# Patient Record
Sex: Female | Born: 1955
Health system: Southern US, Community
[De-identification: ages and names within clinical notes are randomized; demographics above are authoritative.]

## PROBLEM LIST (undated history)

## (undated) DIAGNOSIS — R413 Other amnesia: Secondary | ICD-10-CM

## (undated) DIAGNOSIS — N189 Chronic kidney disease, unspecified: Secondary | ICD-10-CM

## (undated) DIAGNOSIS — G4733 Obstructive sleep apnea (adult) (pediatric): Secondary | ICD-10-CM

## (undated) DIAGNOSIS — I639 Cerebral infarction, unspecified: Secondary | ICD-10-CM

## (undated) DIAGNOSIS — I719 Aortic aneurysm of unspecified site, without rupture: Secondary | ICD-10-CM

## (undated) DIAGNOSIS — D649 Anemia, unspecified: Secondary | ICD-10-CM

## (undated) DIAGNOSIS — G629 Polyneuropathy, unspecified: Secondary | ICD-10-CM

## (undated) DIAGNOSIS — I10 Essential (primary) hypertension: Secondary | ICD-10-CM

## (undated) DIAGNOSIS — R2241 Localized swelling, mass and lump, right lower limb: Secondary | ICD-10-CM

## (undated) DIAGNOSIS — M317 Microscopic polyangiitis: Secondary | ICD-10-CM

## (undated) DIAGNOSIS — C801 Malignant (primary) neoplasm, unspecified: Secondary | ICD-10-CM

## (undated) DIAGNOSIS — E119 Type 2 diabetes mellitus without complications: Secondary | ICD-10-CM

## (undated) HISTORY — DX: Cerebral infarction, unspecified: I63.9

## (undated) HISTORY — PX: WRIST FRACTURE SURGERY: SHX121

## (undated) HISTORY — PX: TOTAL VAGINAL HYSTERECTOMY: SHX2548

## (undated) HISTORY — PX: PARTIAL HIP ARTHROPLASTY: SHX733

## (undated) HISTORY — PX: COLONOSCOPY: SHX174

---

## 2000-07-04 HISTORY — PX: GALLBLADDER SURGERY: SHX652

## 2002-07-04 DIAGNOSIS — N189 Chronic kidney disease, unspecified: Secondary | ICD-10-CM

## 2002-07-04 HISTORY — DX: Chronic kidney disease, unspecified: N18.9

## 2010-02-27 ENCOUNTER — Emergency Department (HOSPITAL_COMMUNITY): Admission: EM | Admit: 2010-02-27 | Discharge: 2010-02-27 | Payer: Self-pay | Admitting: Family Medicine

## 2012-07-28 ENCOUNTER — Emergency Department (HOSPITAL_COMMUNITY): Payer: PRIVATE HEALTH INSURANCE

## 2012-07-28 ENCOUNTER — Inpatient Hospital Stay (HOSPITAL_COMMUNITY)
Admission: EM | Admit: 2012-07-28 | Discharge: 2012-07-31 | DRG: 065 | Disposition: A | Discharge status: Rehabilitation Facility | Payer: PRIVATE HEALTH INSURANCE | Attending: Internal Medicine | Admitting: Internal Medicine

## 2012-07-28 ENCOUNTER — Encounter (HOSPITAL_COMMUNITY): Payer: Self-pay | Admitting: Physical Medicine and Rehabilitation

## 2012-07-28 ENCOUNTER — Observation Stay (HOSPITAL_COMMUNITY): Payer: PRIVATE HEALTH INSURANCE

## 2012-07-28 DIAGNOSIS — G8190 Hemiplegia, unspecified affecting unspecified side: Secondary | ICD-10-CM | POA: Diagnosis present

## 2012-07-28 DIAGNOSIS — E785 Hyperlipidemia, unspecified: Secondary | ICD-10-CM | POA: Diagnosis present

## 2012-07-28 DIAGNOSIS — Z79899 Other long term (current) drug therapy: Secondary | ICD-10-CM

## 2012-07-28 DIAGNOSIS — E876 Hypokalemia: Secondary | ICD-10-CM | POA: Diagnosis present

## 2012-07-28 DIAGNOSIS — E669 Obesity, unspecified: Secondary | ICD-10-CM | POA: Diagnosis present

## 2012-07-28 DIAGNOSIS — E1142 Type 2 diabetes mellitus with diabetic polyneuropathy: Secondary | ICD-10-CM | POA: Diagnosis present

## 2012-07-28 DIAGNOSIS — E1149 Type 2 diabetes mellitus with other diabetic neurological complication: Secondary | ICD-10-CM | POA: Diagnosis present

## 2012-07-28 DIAGNOSIS — R220 Localized swelling, mass and lump, head: Secondary | ICD-10-CM | POA: Diagnosis present

## 2012-07-28 DIAGNOSIS — I6350 Cerebral infarction due to unspecified occlusion or stenosis of unspecified cerebral artery: Secondary | ICD-10-CM

## 2012-07-28 DIAGNOSIS — Z794 Long term (current) use of insulin: Secondary | ICD-10-CM

## 2012-07-28 DIAGNOSIS — I639 Cerebral infarction, unspecified: Secondary | ICD-10-CM | POA: Diagnosis present

## 2012-07-28 DIAGNOSIS — R471 Dysarthria and anarthria: Secondary | ICD-10-CM | POA: Diagnosis present

## 2012-07-28 DIAGNOSIS — Z6834 Body mass index (BMI) 34.0-34.9, adult: Secondary | ICD-10-CM

## 2012-07-28 DIAGNOSIS — I6340 Cerebral infarction due to embolism of unspecified cerebral artery: Principal | ICD-10-CM | POA: Diagnosis present

## 2012-07-28 DIAGNOSIS — Z7982 Long term (current) use of aspirin: Secondary | ICD-10-CM

## 2012-07-28 DIAGNOSIS — M317 Microscopic polyangiitis: Secondary | ICD-10-CM | POA: Diagnosis present

## 2012-07-28 DIAGNOSIS — Z87891 Personal history of nicotine dependence: Secondary | ICD-10-CM

## 2012-07-28 DIAGNOSIS — M300 Polyarteritis nodosa: Secondary | ICD-10-CM | POA: Diagnosis present

## 2012-07-28 DIAGNOSIS — R4701 Aphasia: Secondary | ICD-10-CM | POA: Diagnosis present

## 2012-07-28 DIAGNOSIS — Z23 Encounter for immunization: Secondary | ICD-10-CM

## 2012-07-28 DIAGNOSIS — E119 Type 2 diabetes mellitus without complications: Secondary | ICD-10-CM | POA: Diagnosis present

## 2012-07-28 DIAGNOSIS — I10 Essential (primary) hypertension: Secondary | ICD-10-CM | POA: Diagnosis present

## 2012-07-28 HISTORY — DX: Aortic aneurysm of unspecified site, without rupture: I71.9

## 2012-07-28 HISTORY — DX: Type 2 diabetes mellitus without complications: E11.9

## 2012-07-28 HISTORY — DX: Microscopic polyangiitis: M31.7

## 2012-07-28 HISTORY — DX: Obstructive sleep apnea (adult) (pediatric): G47.33

## 2012-07-28 HISTORY — DX: Essential (primary) hypertension: I10

## 2012-07-28 LAB — DIFFERENTIAL
Basophils Relative: 0 % (ref 0–1)
Eosinophils Absolute: 0.4 10*3/uL (ref 0.0–0.7)
Lymphocytes Relative: 35 % (ref 12–46)
Lymphs Abs: 3.4 10*3/uL (ref 0.7–4.0)
Monocytes Absolute: 0.5 10*3/uL (ref 0.1–1.0)
Monocytes Relative: 6 % (ref 3–12)
Neutro Abs: 5.4 10*3/uL (ref 1.7–7.7)
Neutrophils Relative %: 55 % (ref 43–77)

## 2012-07-28 LAB — COMPREHENSIVE METABOLIC PANEL
CO2: 26 mEq/L (ref 19–32)
Creatinine, Ser: 0.79 mg/dL (ref 0.50–1.10)
GFR calc Af Amer: >90 mL/min (ref >90)
Sodium: 137 mEq/L (ref 135–145)
Total Protein: 8 g/dL (ref 6.0–8.3)

## 2012-07-28 LAB — URINE MICROSCOPIC-ADD ON

## 2012-07-28 LAB — URINALYSIS, ROUTINE W REFLEX MICROSCOPIC
Glucose, UA: NEGATIVE mg/dL
Hgb urine dipstick: NEGATIVE
Urobilinogen, UA: 0.2 mg/dL (ref 0.0–1.0)

## 2012-07-28 LAB — CBC
Hemoglobin: 12.8 g/dL (ref 12.0–15.0)
MCH: 30.7 pg (ref 26.0–34.0)
MCHC: 33.3 g/dL (ref 30.0–36.0)
RBC: 4.17 MIL/uL (ref 3.87–5.11)
RDW: 12.8 % (ref 11.5–15.5)
WBC: 9.7 10*3/uL (ref 4.0–10.5)

## 2012-07-28 LAB — POCT I-STAT TROPONIN I: Troponin i, poc: 0 ng/mL (ref 0.00–0.08)

## 2012-07-28 LAB — GLUCOSE, CAPILLARY
Glucose-Capillary: 124 mg/dL — ABNORMAL HIGH (ref 70–99)
Glucose-Capillary: 239 mg/dL — ABNORMAL HIGH (ref 70–99)

## 2012-07-28 LAB — APTT: aPTT: 39 seconds — ABNORMAL HIGH (ref 24–37)

## 2012-07-28 LAB — PROTIME-INR: INR: 1 (ref 0.00–1.49)

## 2012-07-28 LAB — RAPID URINE DRUG SCREEN, HOSP PERFORMED
Barbiturates: NOT DETECTED
Benzodiazepines: NOT DETECTED

## 2012-07-28 MED ORDER — INSULIN ASPART 100 UNIT/ML ~~LOC~~ SOLN
0.0000 [IU] | SUBCUTANEOUS | Status: DC
  Administered 2012-07-28: 3 [IU] via SUBCUTANEOUS
  Administered 2012-07-29: 1 [IU] via SUBCUTANEOUS
  Administered 2012-07-29: 3 [IU] via SUBCUTANEOUS
  Administered 2012-07-29 (×2): 1 [IU] via SUBCUTANEOUS
  Administered 2012-07-30: 2 [IU] via SUBCUTANEOUS

## 2012-07-28 MED ORDER — ASPIRIN 81 MG PO CHEW
324.0000 mg | CHEWABLE_TABLET | Freq: Once | ORAL | Status: AC
  Administered 2012-07-28: 324 mg via ORAL
  Filled 2012-07-28: qty 4

## 2012-07-28 MED ORDER — SENNOSIDES-DOCUSATE SODIUM 8.6-50 MG PO TABS: 1.0000 | ORAL_TABLET | Freq: Every evening | ORAL | Status: DC | PRN

## 2012-07-28 MED ORDER — POTASSIUM CHLORIDE CRYS ER 20 MEQ PO TBCR
20.0000 meq | EXTENDED_RELEASE_TABLET | Freq: Two times a day (BID) | ORAL | Status: DC
  Administered 2012-07-28 – 2012-07-30 (×5): 20 meq via ORAL
  Filled 2012-07-28 (×7): qty 1

## 2012-07-28 MED ORDER — ENOXAPARIN SODIUM 40 MG/0.4ML ~~LOC~~ SOLN
40.0000 mg | SUBCUTANEOUS | Status: DC
  Administered 2012-07-28 – 2012-07-30 (×3): 40 mg via SUBCUTANEOUS
  Filled 2012-07-28 (×4): qty 0.4

## 2012-07-28 MED ORDER — INFLUENZA VIRUS VACC SPLIT PF IM SUSP
0.5000 mL | INTRAMUSCULAR | Status: AC
  Administered 2012-07-29: 0.5 mL via INTRAMUSCULAR
  Filled 2012-07-28: qty 0.5

## 2012-07-28 MED ORDER — HYDRALAZINE HCL 20 MG/ML IJ SOLN
5.0000 mg | Freq: Four times a day (QID) | INTRAMUSCULAR | Status: DC | PRN
  Filled 2012-07-28: qty 0.25

## 2012-07-28 NOTE — H&P (Addendum)
[facility] History and Physical  Rhilyn Davis XBJ:[id number] DOB: April 16, 1956 DOA: 07/28/2012  Referring physician: EDP PCP: Geraldo Pitter, MD  Specialists:   Chief Complaint: Slurred Speech and Numbness Right Side  HPI: Sandy Davis is a 57 y.o. female who felt dizzy and weak and then fell down to the floor, and began to experience blurry vision, slurring of her speech and right sided weakness.   She reports also having a frontal headache at that time.   Her symptoms began at 2:30 pm and she arrived at the ED at 1615 and a Code Stroke was called.  She was assessed  By Neurology and was not deemed appropriate for TPA therapy after evaluation and having a NIH score of 2.   She was found to have acute infarcts of the left cerebellum and left inferior occipital lobe anda subacute lacunar infarct in the Superior Left Thalmus.      Review of Systems: The patient denies anorexia, fever, chills, weight loss, vision loss, decreased hearing, hoarseness, chest pain, syncope, dyspnea on exertion, peripheral edema, balance deficits, hemoptysis, abdominal pain, nausea, vomiting, diarrhea, hematemesis, melena, hematochezia, severe indigestion/heartburn, dysuria, hematuria, incontinence, urinary retention,  suspicious skin lesions, transient blindness, depression, unusual weight change, abnormal bleeding, enlarged lymph nodes, angioedema, and breast masses.    Past Medical History  Diagnosis Date  . Hypertension   . Diabetes mellitus without complication   . Aortic aneurysm    Physical Exam:  GEN:  Pleasant 57 year old Obese African American Female examined  and in no acute distress; cooperative with exam Filed Vitals:   07/28/12 1730 07/28/12 1804 07/28/12 1859 07/28/12 2103  BP: 177/119 162/90 183/98 168/90  Pulse: 63 72 67 60  Temp:    98.1 F (36.7 C)  TempSrc:    Oral  Resp:  14 14 16   SpO2: 100% 99% 97% 100%   Blood pressure 168/90, pulse 60, temperature 98.1 F (36.7 C),  temperature source Oral, resp. rate 16, SpO2 100.00%. PSYCH: She is alert and oriented x4; does not appear anxious does not appear depressed; affect is normal HEENT: Normocephalic and Atraumatic, Mucous membranes pink; PERRLA; EOM intact; Fundi:  Benign;  No scleral icterus, Nares: Patent, Oropharynx: Clear, Fair Dentition, Neck:  FROM, no cervical lymphadenopathy nor thyromegaly or carotid bruit; no JVD; Breasts:: Not examined CHEST WALL: No tenderness CHEST: Normal respiration, clear to auscultation bilaterally HEART: Regular rate and rhythm; no murmurs rubs or gallops BACK: No kyphosis or scoliosis; no CVA tenderness ABDOMEN: Positive Bowel Sounds,  Obese, soft non-tender; no masses, no organomegaly. Rectal Exam: Not done EXTREMITIES: No cyanosis, clubbing or edema; no ulcerations. Genitalia: not examined PULSES: 2+ and symmetric SKIN: Normal hydration no rash or ulceration CNS: Cranial nerves 2-12 grossly intact;   No facial droop, No dysarthria but hesitation,  Decreased grip strength Right hand versus Left Hand,    Cerebellar function:  Finger to Nose  Poor with right Index finger, MiId Right Hemiparesis.       Labs on Admission:  Basic Metabolic Panel:  Lab 07/28/12 3086  NA 137  K 3.4*  CL 101  CO2 26  GLUCOSE 122*  BUN 13  CREATININE 0.79  CALCIUM 10.9*  MG --  PHOS --   Liver Function Tests:  Lab 07/28/12 1645  AST 15  ALT 16  ALKPHOS 103  BILITOT 0.2*  PROT 8.0  ALBUMIN 4.0   No results found for this basename: LIPASE:5,AMYLASE:5 in the last 168 hours No results found for this  basename: AMMONIA:5 in the last 168 hours CBC:  Lab 07/28/12 1645  WBC 9.7  NEUTROABS 5.4  HGB 12.8  HCT 38.4  MCV 92.1  PLT 262   Cardiac Enzymes:  Lab 07/28/12 1645  CKTOTAL --  CKMB --  CKMBINDEX --  TROPONINI <0.30    BNP (last 3 results) No results found for this basename: PROBNP:3 in the last 8760 hours CBG:  Lab 07/28/12 1638  GLUCAP 124*    Radiological  Exams on Admission: Ct Head Wo Contrast  07/28/2012  *RADIOLOGY REPORT*  Clinical Data: 57 year old female Code stroke.  Right side weakness.  Last seen normal 1430 hours.  CT HEAD WITHOUT CONTRAST  Technique:  Contiguous axial images were obtained from the base of the skull through the vertex without contrast.  Comparison: None.  Findings: Visualized paranasal sinuses and mastoids are clear.  No acute osseous abnormality identified.  Visualized orbit soft tissues are within normal limits.  Visualized scalp soft tissues are within normal limits.  The 15 mm focus of hypodensity in the left lateral cerebellum on series 2 image 7.  No associated hemorrhage or mass effect.  Other posterior fossa gray-white matter differentiation within normal limits.  Hypodensity in the left deep gray matter nuclei near the genu of the left internal capsule.  No mass effect or hemorrhage.  Minimal to mild cerebral white matter hypodensity elsewhere.  No ventriculomegaly. No midline shift, mass effect, or evidence of mass lesion.  No acute intracranial hemorrhage identified.  No supratentorial acute cortically based infarct identified. No suspicious intracranial vascular hyperdensity.  IMPRESSION: 1. Small subacute versus chronic left cerebellar and left internal capsule infarcts.  No mass effect or hemorrhage. 2.  No other acute intracranial abnormality identified.  Critical Value/emergent results were called by telephone at the time of interpretation on 07/28/2012 at 1700 hours to Dr. Amada Jupiter, who verbally acknowledged these results.   Original Report Authenticated By: Erskine Speed, M.D.    Mr Brain Wo Contrast  07/28/2012  *RADIOLOGY REPORT*  Clinical Data: 56 year old female with acute onset right side weakness.  Hypertension diabetes.  MRI HEAD WITHOUT CONTRAST  Technique:  Multiplanar, multiecho pulse sequences of the brain and surrounding structures were obtained according to standard protocol without intravenous contrast.   Comparison: Head CT 1644 hours the same day.  Findings: Restricted diffusion in the left cerebellar hemisphere corresponding in part to the CT findings made earlier.  Associated T2 new and mild FLAIR hyperintensity with no mass effect or hemorrhage.  In addition, there is a tiny focus of restricted diffusion in the undersurface of the left occipital lobe (series 3 image 10) with mild T2 hyperintensity.  At the left deep gray matter nuclei there is restricted diffusion in the lateral left thalamus bordering on or involving the posterior limb of the left internal capsule (series 3 image 14). However, this CT finding near the genu of the left internal capsule and superior left thalamus appears only mildly restricted, and has a greater degree of FLAIR hyperintensity.  No mass effect or hemorrhage associated with these.  No diffusion abnormality identified elsewhere.  The distal left vertebral artery flow void is lost (series 5 image 3 and adjacent images).  Other Major intracranial vascular flow voids are preserved.  However, there is conspicuous increased FLAIR signal and decreased T2* signal in the left PCA (series 6 image 9 and series 7 image 9).  No ventriculomegaly.  No midline shift. No acute intracranial hemorrhage identified.   Negative pituitary, cervicomedullary junction  and visualized cervical spine. Visualized bone marrow signal is within normal limits.  Outside of the acute findings there is mild scattered mostly subcortical cerebral white matter T2 and FLAIR hyperintensity.  Visualized orbit soft tissues are within normal limits.  Visualized paranasal sinuses and mastoids are clear.  Negative scalp soft tissues.  IMPRESSION: 1.  Scattered small acute infarcts in the left cerebellum, inferior left occipital lobe, and lateral thalamus at or near the posterior limb of the left internal capsule.  No mass effect or hemorrhage associated with these.  The largest individual lesion corresponds to that seen by CT  in the cerebellum. 2.  The other CT lesion corresponds to a more subacute appearing lacunar infarct in the superior left thalamus nearer the genu of the internal capsule. 3.  Poor flow or occlusion of the distal left vertebral artery. Suspect poor flow or occlusion also at the left PCA.  Study discussed by telephone with Dr. Amada Jupiter on 1944 hours at 07/28/2012.   Original Report Authenticated By: Erskine Speed, M.D.     EKG: Independently reviewed.   Assessment/Plan: Principal Problem:  *CVA (cerebral vascular accident) Active Problems:  Stroke  Hypertension  Diabetes  Microscopic polyangiitis  Obesity Hypokalemia   1.  CVA- Admitted to complete CVA workup,  Carotid US and ,Fasting lipids ordered, Neuro checks, and BP control,    2.  Hypertension-  Continue regular medications, PRN IV hydralazine for SBP > 160.     3.  DM2- Continue regular medications, and PRN SSI coverage.     4.  Hypokalemia-  Caused by her diuretic, replete K+ and check Magnesium level.     5.  Microscopic Polyangitis- Stable  6.  Obesity- Stable.       Code Status:  FULL CODE Family Communication:  Daughter at Bedside Disposition Plan:  TBA  Time spent: 41 Minutes  Ron Parker [facility] Pager [phone]  If 7PM-7AM, please contact night-coverage [email or web address] Password [facility] 07/28/2012, 10:03 PM

## 2012-07-28 NOTE — ED Notes (Signed)
DR. Anise Salvo ( NEUROLOGIST) AT BEDSIDE.

## 2012-07-28 NOTE — Consult Note (Addendum)
Reason for Consult: Code stroke Referring Physician: Ranae Palms, D  CC: right sided numbness  History is obtained from:Patient   HPI: Sandy Davis is a 57 y.o. female who was bending down earlier today when she was seen to have whole body shaking. The patient remembers the episode and did not lose conciousness or awareness at any point during the episode. Following this, she had some slurred speech and per her friend was "dragging her right leg). She had double vision at the start of the episode, but this has improved.   Time of onset: 2 - 2:30 pm(patient unsure) tpa given: no, mild deficits, ? Seizure at onset.  NIHSS: 2  ROS: A 14 point ROS was performed and is negative except as noted in the HPI.  Past Medical History  Diagnosis Date  . Hypertension   . Diabetes mellitus without complication   . Aortic aneurysm     Family History: No history of stroke  Social History: Tob: quit 20 years ago.   Exam: Current vital signs: BP 183/98  Pulse 67  Temp 98.4 F (36.9 C) (Oral)  Resp 14  SpO2 97% Vital signs in last 24 hours: Temp:  [98.4 F (36.9 C)-99.1 F (37.3 C)] 98.4 F (36.9 C) (01/25 1717) Pulse Rate:  [63-72] 67  (01/25 1859) Resp:  [14-16] 14  (01/25 1859) BP: (162-198)/(90-119) 183/98 mmHg (01/25 1859) SpO2:  [97 %-100 %] 97 % (01/25 1859)  General: In bed, NAD CV: RRR Mental Status: Patient is awake, alert, oriented to person, place, month, year, and situation. Immediate and remote memory are intact. Patient is able to give a clear and coherent history. No signs of aphasia or dysarthria.  Cranial Nerves: II: Visual Fields are full. Pupils are equal, round, and reactive to light.  Discs are difficult to visualize. III,IV, VI: EOMI without ptosis or diploplia.  V: Facial sensation is diminished on right to pin, does not split the midline.  VII: Facial movement is symmetric.  VIII: hearing is intact to voice X: Uvula elevates symmetrically XI: Shoulder  shrug is symmetric. XII: tongue is midline without atrophy or fasciculations.  Motor: Tone is normal. Bulk is normal. 5/5 strength was present in all four extremities. She had give way weakness of the right arm, but with encouragement would hold it without drift. Potentially mild 4+/5 weakness.  Sensory: Sensation is diminished to pin on the right arm and leg.  Deep Tendon Reflexes: 2+ and symmetric in the biceps and patellae.  Cerebellar: FNF and HKS are intact bilaterally Gait: Not tested due to emergent nature of evaluation and multiple monitors in ICU setting.   I have reviewed labs in epic and the results pertinent to this consultation are: CMP, CBC unremarkable.   I have reviewed the images obtained:MRI brain - small cerebellar and thalamic infarcts.   Impression: 57 yo F with new posterior circulation infarcts. On exam, her strength was inconsistent and I suspect that her subjective sense of weakness may be more due to sensory loss than true weakness, though there certainly may be some true mild weakness as well. Given the mild nature of her deficits and unclear episode at the beginning of her symptoms, tpa was not given.   Recommendations: 1. HgbA1c, fasting lipid panel 2. MRA  of the brain without contrast 3. PT consult, OT consult, Speech consult 4. Echocardiogram 5. Carotid dopplers 6. Prophylactic therapy-Antiplatelet med: Aspirin - dose 325mg  PO if passes swallow eval, 300mg  PR if not.  7. Risk factor modification  8. Telemetry monitoring 9. Frequent neuro checks 10. Permissive hypertension, would treat for SBP > 220 or DBP > 120.   Ritta Slot, MD [facility] [phone]  If 7pm- 7am, please page neurology on call at [phone].

## 2012-07-28 NOTE — ED Notes (Signed)
DR. Ranae Palms INITIATED CODE STROKE ON PT. AFTER EVALUATION .

## 2012-07-28 NOTE — ED Notes (Signed)
RETURNED FROM CT SCAN . DENIES PAIN OR DISCOMFORT , RESPIRATIONS UNLABORED , IV SITE UNREMARKABLE , DR. Anise Salvo  AT BEDSIDE .

## 2012-07-28 NOTE — ED Notes (Addendum)
Pt presents to department for evaluation of generalized weakness, R sided numbness, double vision and dizziness. Onset @ 3:00pm while at friend's house. Upon arrival pt ambulatory to triage, able to move all extremities. No facial droop. Speech clear. No neurological deficits noted. Equal grips. Pt is alert and able to answer questions accurately. Denies pain at the time.

## 2012-07-28 NOTE — ED Notes (Signed)
REPORT GIVEN TO 4 NORTH UNIT NURSE. TRANSPORTED IN STABLE CONDITION , DENIES PAIN , RESPIRATIONS UNLABORED , IV SITE UNREMARKABLE.

## 2012-07-28 NOTE — ED Notes (Signed)
PT. TRANSPORTED TO CT SCAN WITH DR. Anise Salvo ( NEIROLOGIST) AND RAPID RESPONSE NURSE.

## 2012-07-28 NOTE — Progress Notes (Signed)
Patient admitted to 84N08. Alert and oriented. Walk to bathroom with two assist. Noticeable right left drift and right sided weakness.  Oriented to room. Will continue to monitor pt.

## 2012-07-28 NOTE — ED Notes (Signed)
CBG was 124. Notified Nurse Reita Cliche.

## 2012-07-28 NOTE — ED Notes (Signed)
TRANSPORTED TO MRI

## 2012-07-28 NOTE — ED Provider Notes (Signed)
History     CSN: [id number]  Arrival date & time 07/28/12  1612   First MD Initiated Contact with Patient 07/28/12 1619      Chief Complaint  Patient presents with  . Fatigue    (Consider location/radiation/quality/duration/timing/severity/associated sxs/prior treatment) HPI Pt states that while at friend's house she had shaking episode with eyes rolled back and collapsed to ground. Pt had no LOC or incontinence. This was at 1430 today. She then noticed R facial and RUE/RLE numbness and weakness. She took several aspirin and weakness has improved though still present. She c/o double vision. Denies HA, CP, abd pain, N/V. No prev history of CVA.  Past Medical History  Diagnosis Date  . Hypertension   . Diabetes mellitus without complication   . Aortic aneurysm     History reviewed. No pertinent past surgical history.  No family history on file.  History  Substance Use Topics  . Smoking status: Never Smoker   . Smokeless tobacco: Not on file  . Alcohol Use: No    OB History    Grav Para Term Preterm Abortions TAB SAB Ect Mult Living                  Review of Systems  Constitutional: Negative for fever and chills.  HENT: Negative for neck pain.   Eyes: Positive for visual disturbance.  Respiratory: Negative for shortness of breath.   Cardiovascular: Negative for chest pain and palpitations.  Gastrointestinal: Negative for nausea, vomiting, abdominal pain and diarrhea.  Musculoskeletal: Negative for myalgias and back pain.  Skin: Negative for rash and wound.  Neurological: Positive for weakness, light-headedness and numbness. Negative for syncope and headaches.  All other systems reviewed and are negative.    Allergies  Review of patient's allergies indicates no known allergies.  Home Medications   Current Outpatient Rx  Name  Route  Sig  Dispense  Refill  . AMLODIPINE BESYLATE 10 MG PO TABS   Oral   Take 10 mg by mouth daily.         . INSULIN DETEMIR  100 UNIT/ML [place] SOLN   Subcutaneous   Inject 40 Units into the skin 2 (two) times daily.         Marland Kitchen METFORMIN HCL 500 MG PO TABS   Oral   Take 500 mg by mouth 4 (four) times daily.         Marland Kitchen SIMVASTATIN 40 MG PO TABS   Oral   Take 40 mg by mouth every evening.           BP 183/98  Pulse 67  Temp 98.4 F (36.9 C) (Oral)  Resp 14  SpO2 97%  Physical Exam  Nursing note and vitals reviewed. Constitutional: She is oriented to person, place, and time. She appears well-developed and well-nourished. No distress.  HENT:  Head: Normocephalic and atraumatic.  Mouth/Throat: Oropharynx is clear and moist.  Eyes: EOM are normal. Pupils are equal, round, and reactive to light.  Neck: Normal range of motion. Neck supple.  Cardiovascular: Normal rate and regular rhythm.   Pulmonary/Chest: Effort normal and breath sounds normal. No respiratory distress. She has no wheezes. She has no rales. She exhibits no tenderness.  Abdominal: Soft. Bowel sounds are normal. She exhibits no mass. There is no tenderness. There is no rebound and no guarding.  Musculoskeletal: Normal range of motion. She exhibits no edema and no tenderness.  Neurological: She is alert and oriented to person, place, and time.  Speaks with lisp. Slow deliberate movement with RUE/RLE. Question RLE weakness compared to left. Subjective L sided facial and extremity numbness.   Skin: Skin is warm and dry. No rash noted. No erythema.  Psychiatric: She has a normal mood and affect. Her behavior is normal.    ED Course  Procedures (including critical care time)  Labs Reviewed  APTT - Abnormal; Notable for the following:    aPTT 39 (*)     All other components within normal limits  COMPREHENSIVE METABOLIC PANEL - Abnormal; Notable for the following:    Potassium 3.4 (*)     Glucose, Bld 122 (*)     Calcium 10.9 (*)     Total Bilirubin 0.2 (*)     All other components within normal limits  URINALYSIS, ROUTINE W REFLEX  MICROSCOPIC - Abnormal; Notable for the following:    APPearance CLOUDY (*)     Leukocytes, UA SMALL (*)     All other components within normal limits  GLUCOSE, CAPILLARY - Abnormal; Notable for the following:    Glucose-Capillary 124 (*)     All other components within normal limits  URINE MICROSCOPIC-ADD ON - Abnormal; Notable for the following:    Squamous Epithelial / LPF MANY (*)     Casts HYALINE CASTS (*)     Crystals CA OXALATE CRYSTALS (*)     All other components within normal limits  PROTIME-INR  CBC  DIFFERENTIAL  TROPONIN I  URINE RAPID DRUG SCREEN (HOSP PERFORMED)  POCT I-STAT TROPONIN I   Ct Head Wo Contrast  07/28/2012  *RADIOLOGY REPORT*  Clinical Data: 57 year old female Code stroke.  Right side weakness.  Last seen normal 1430 hours.  CT HEAD WITHOUT CONTRAST  Technique:  Contiguous axial images were obtained from the base of the skull through the vertex without contrast.  Comparison: None.  Findings: Visualized paranasal sinuses and mastoids are clear.  No acute osseous abnormality identified.  Visualized orbit soft tissues are within normal limits.  Visualized scalp soft tissues are within normal limits.  The 15 mm focus of hypodensity in the left lateral cerebellum on series 2 image 7.  No associated hemorrhage or mass effect.  Other posterior fossa gray-white matter differentiation within normal limits.  Hypodensity in the left deep gray matter nuclei near the genu of the left internal capsule.  No mass effect or hemorrhage.  Minimal to mild cerebral white matter hypodensity elsewhere.  No ventriculomegaly. No midline shift, mass effect, or evidence of mass lesion.  No acute intracranial hemorrhage identified.  No supratentorial acute cortically based infarct identified. No suspicious intracranial vascular hyperdensity.  IMPRESSION: 1. Small subacute versus chronic left cerebellar and left internal capsule infarcts.  No mass effect or hemorrhage. 2.  No other acute  intracranial abnormality identified.  Critical Value/emergent results were called by telephone at the time of interpretation on 07/28/2012 at 1700 hours to Dr. Amada Jupiter, who verbally acknowledged these results.   Original Report Authenticated By: Erskine Speed, M.D.    Mr Brain Wo Contrast  07/28/2012  *RADIOLOGY REPORT*  Clinical Data: 57 year old female with acute onset right side weakness.  Hypertension diabetes.  MRI HEAD WITHOUT CONTRAST  Technique:  Multiplanar, multiecho pulse sequences of the brain and surrounding structures were obtained according to standard protocol without intravenous contrast.  Comparison: Head CT 1644 hours the same day.  Findings: Restricted diffusion in the left cerebellar hemisphere corresponding in part to the CT findings made earlier.  Associated T2 new and mild  FLAIR hyperintensity with no mass effect or hemorrhage.  In addition, there is a tiny focus of restricted diffusion in the undersurface of the left occipital lobe (series 3 image 10) with mild T2 hyperintensity.  At the left deep gray matter nuclei there is restricted diffusion in the lateral left thalamus bordering on or involving the posterior limb of the left internal capsule (series 3 image 14). However, this CT finding near the genu of the left internal capsule and superior left thalamus appears only mildly restricted, and has a greater degree of FLAIR hyperintensity.  No mass effect or hemorrhage associated with these.  No diffusion abnormality identified elsewhere.  The distal left vertebral artery flow void is lost (series 5 image 3 and adjacent images).  Other Major intracranial vascular flow voids are preserved.  However, there is conspicuous increased FLAIR signal and decreased T2* signal in the left PCA (series 6 image 9 and series 7 image 9).  No ventriculomegaly.  No midline shift. No acute intracranial hemorrhage identified.   Negative pituitary, cervicomedullary junction and visualized cervical spine.  Visualized bone marrow signal is within normal limits.  Outside of the acute findings there is mild scattered mostly subcortical cerebral white matter T2 and FLAIR hyperintensity.  Visualized orbit soft tissues are within normal limits.  Visualized paranasal sinuses and mastoids are clear.  Negative scalp soft tissues.  IMPRESSION: 1.  Scattered small acute infarcts in the left cerebellum, inferior left occipital lobe, and lateral thalamus at or near the posterior limb of the left internal capsule.  No mass effect or hemorrhage associated with these.  The largest individual lesion corresponds to that seen by CT in the cerebellum. 2.  The other CT lesion corresponds to a more subacute appearing lacunar infarct in the superior left thalamus nearer the genu of the internal capsule. 3.  Poor flow or occlusion of the distal left vertebral artery. Suspect poor flow or occlusion also at the left PCA.  Study discussed by telephone with Dr. Amada Jupiter on 1944 hours at 07/28/2012.   Original Report Authenticated By: Erskine Speed, M.D.      1. CVA (cerebral vascular accident)   2. Hypertension      Date: 07/28/2012  Rate: 64  Rhythm: normal sinus rhythm  QRS Axis: normal  Intervals: normal  ST/T Wave abnormalities: normal  Conduction Disutrbances:none  Narrative Interpretation:   Old EKG Reviewed: none available    MDM  Code stroke called for possible new onset deficits.   Dr Amada Jupiter at bedside and eval'd pt. Inconsistent exam and NIHSS 2. Suggested MRI.  Discussed MRI results with Dr Amada Jupiter. Not tPA candidate due to mild symptoms. Advised admit to hospitalist.       Loren Racer, MD 07/28/12 [phone]

## 2012-07-29 ENCOUNTER — Inpatient Hospital Stay (HOSPITAL_COMMUNITY): Payer: PRIVATE HEALTH INSURANCE

## 2012-07-29 LAB — GLUCOSE, CAPILLARY
Glucose-Capillary: 112 mg/dL — ABNORMAL HIGH (ref 70–99)
Glucose-Capillary: 128 mg/dL — ABNORMAL HIGH (ref 70–99)
Glucose-Capillary: 126 mg/dL — ABNORMAL HIGH (ref 70–99)

## 2012-07-29 LAB — LIPID PANEL
LDL Cholesterol: 81 mg/dL (ref 0–99)
VLDL: 18 mg/dL (ref 0–40)

## 2012-07-29 LAB — HOMOCYSTEINE: Homocysteine: 9.6 umol/L (ref 4.0–15.4)

## 2012-07-29 MED ORDER — IOHEXOL 350 MG/ML SOLN
75.0000 mL | Freq: Once | INTRAVENOUS | Status: AC | PRN
  Administered 2012-07-29: 75 mL via INTRAVENOUS

## 2012-07-29 MED ORDER — ACETAMINOPHEN 325 MG PO TABS: 650.0000 mg | ORAL_TABLET | Freq: Four times a day (QID) | ORAL | Status: DC | PRN

## 2012-07-29 MED ORDER — AMLODIPINE BESYLATE 10 MG PO TABS
10.0000 mg | ORAL_TABLET | Freq: Every day | ORAL | Status: DC
  Administered 2012-07-29 – 2012-07-31 (×3): 10 mg via ORAL
  Filled 2012-07-29 (×4): qty 1

## 2012-07-29 MED ORDER — HYDRALAZINE HCL 20 MG/ML IJ SOLN
10.0000 mg | Freq: Four times a day (QID) | INTRAMUSCULAR | Status: DC | PRN
  Filled 2012-07-29: qty 0.5

## 2012-07-29 MED ORDER — INSULIN DETEMIR 100 UNIT/ML ~~LOC~~ SOLN
40.0000 [IU] | Freq: Two times a day (BID) | SUBCUTANEOUS | Status: DC
  Administered 2012-07-29 – 2012-07-30 (×4): 40 [IU] via SUBCUTANEOUS
  Filled 2012-07-29 (×2): qty 10

## 2012-07-29 MED ORDER — ATORVASTATIN CALCIUM 20 MG PO TABS
20.0000 mg | ORAL_TABLET | Freq: Every day | ORAL | Status: DC
  Administered 2012-07-29 – 2012-07-31 (×3): 20 mg via ORAL
  Filled 2012-07-29 (×4): qty 1

## 2012-07-29 MED ORDER — ASPIRIN 325 MG PO TABS
325.0000 mg | ORAL_TABLET | Freq: Every day | ORAL | Status: DC
  Administered 2012-07-29 – 2012-07-31 (×3): 325 mg via ORAL
  Filled 2012-07-29 (×4): qty 1

## 2012-07-29 MED ORDER — SIMVASTATIN 40 MG PO TABS: 40.0000 mg | ORAL_TABLET | Freq: Every evening | ORAL | Status: DC

## 2012-07-29 NOTE — Progress Notes (Signed)
*  PRELIMINARY RESULTS* Vascular Ultrasound Carotid Duplex (Doppler) has been completed.  Preliminary findings: Bilateral:  No evidence of hemodynamically significant internal carotid artery stenosis.   Vertebral artery flow is antegrade.      Farrel Demark, RDMS, RVT 07/29/2012, 10:33 AM

## 2012-07-29 NOTE — Progress Notes (Signed)
[facility] PROGRESS NOTE  Brenlynn Fake ZOX:[id number] DOB: [birth year] DOA: 07/28/2012 PCP: Geraldo Pitter, MD  Assessment/Plan: Scattered small acute infarcts in the left cerebellum, inferior left occipital lobe, and lateral thalamus at or near the posterior limb of the left internal capsule Continue 2-D echocardiogram, if negative will likely need transesophageal echocardiogram.  Continue aspirin 325 mg daily.  Continue carotid Dopplers.  Continue PT/OT/speech therapy.  Appreciate neurology evaluation.  Hypercoagulability workup sent by neurology which includes cardiolipin antibody, factor V Leiden, homocystine, beta-2 glycoprotein i abs, lupus anticoagulant panel, protein C, protein S, and anti-thrombin III. LDL 81, simvastatin changed atorvastatin.  Hemoglobin A1c pending.  Hypertension Continue home antihypertensive medications.  Diabetes type 2 uncontrolled with complications Sliding scale insulin.  Diabetic diet.  Continue levemir 40 units SQ daily.  Metformin held.  Hypokalemia Replace as needed.  Obesity Diet and exercise as outpatient.  Hyperlipidemia LDL 81, continue statin.  Prophylaxis Lovenox.  Code Status: Full code. Family Communication: No family at bedside.  Disposition Plan: Pending.  Consultants:  Neurology  Procedures:  As above.  Antibiotics:  None.  HPI/Subjective: Complaining of decreased sensation of the right.  Objective: Filed Vitals:   07/29/12 0200 07/29/12 0417 07/29/12 0539 07/29/12 0943  BP: 152/85 143/77 159/86 147/98  Pulse: 65 65 65 85  Temp: 98.1 F (36.7 C) 97.9 F (36.6 C) 98 F (36.7 C) 98.3 F (36.8 C)  TempSrc: Oral Oral Oral Oral  Resp: 18 18 18 17   SpO2: 97% 99% 98% 99%   No intake or output data in the 24 hours ending 07/29/12 1112 There were no vitals filed for this visit.  Exam: Physical Exam: General: Awake, Oriented, No acute distress. HEENT: EOMI. Neck: Supple CV: S1 and S2 Lungs: Clear to  ascultation bilaterally Abdomen: Soft, Nontender, Nondistended, +bowel sounds. Ext: Good pulses. Trace edema.  Data Reviewed: Basic Metabolic Panel:  Lab 07/28/12 8119  NA 137  K 3.4*  CL 101  CO2 26  GLUCOSE 122*  BUN 13  CREATININE 0.79  CALCIUM 10.9*  MG --  PHOS --   Liver Function Tests:  Lab 07/28/12 1645  AST 15  ALT 16  ALKPHOS 103  BILITOT 0.2*  PROT 8.0  ALBUMIN 4.0   No results found for this basename: LIPASE:5,AMYLASE:5 in the last 168 hours No results found for this basename: AMMONIA:5 in the last 168 hours CBC:  Lab 07/28/12 1645  WBC 9.7  NEUTROABS 5.4  HGB 12.8  HCT 38.4  MCV 92.1  PLT 262   Cardiac Enzymes:  Lab 07/28/12 1645  CKTOTAL --  CKMB --  CKMBINDEX --  TROPONINI <0.30   BNP (last 3 results) No results found for this basename: PROBNP:3 in the last 8760 hours CBG:  Lab 07/29/12 0807 07/29/12 0625 07/29/12 0232 07/28/12 2254 07/28/12 1638  GLUCAP 128* 112* 113* 239* 124*    No results found for this or any previous visit (from the past 240 hour(s)).   Studies: Dg Chest 2 View  07/28/2012  *RADIOLOGY REPORT*  Clinical Data: CVA.  CHEST - 2 VIEW  Comparison:  None.  Findings: The lungs are well-aerated and clear.  There is no evidence of focal opacification, pleural effusion or pneumothorax.  The heart is normal in size; the mediastinal contour is within normal limits.  No acute osseous abnormalities are seen.   Clips are noted within the right upper quadrant, reflecting prior cholecystectomy.  IMPRESSION: No acute cardiopulmonary process seen.   Original Report Authenticated By: Tonia Ghent,  M.D.    Ct Head Wo Contrast  07/28/2012  *RADIOLOGY REPORT*  Clinical Data: 57 year old female Code stroke.  Right side weakness.  Last seen normal 1430 hours.  CT HEAD WITHOUT CONTRAST  Technique:  Contiguous axial images were obtained from the base of the skull through the vertex without contrast.  Comparison: None.  Findings: Visualized  paranasal sinuses and mastoids are clear.  No acute osseous abnormality identified.  Visualized orbit soft tissues are within normal limits.  Visualized scalp soft tissues are within normal limits.  The 15 mm focus of hypodensity in the left lateral cerebellum on series 2 image 7.  No associated hemorrhage or mass effect.  Other posterior fossa gray-white matter differentiation within normal limits.  Hypodensity in the left deep gray matter nuclei near the genu of the left internal capsule.  No mass effect or hemorrhage.  Minimal to mild cerebral white matter hypodensity elsewhere.  No ventriculomegaly. No midline shift, mass effect, or evidence of mass lesion.  No acute intracranial hemorrhage identified.  No supratentorial acute cortically based infarct identified. No suspicious intracranial vascular hyperdensity.  IMPRESSION: 1. Small subacute versus chronic left cerebellar and left internal capsule infarcts.  No mass effect or hemorrhage. 2.  No other acute intracranial abnormality identified.  Critical Value/emergent results were called by telephone at the time of interpretation on 07/28/2012 at 1700 hours to Dr. Amada Jupiter, who verbally acknowledged these results.   Original Report Authenticated By: Erskine Speed, M.D.    Mr Brain Wo Contrast  07/28/2012  *RADIOLOGY REPORT*  Clinical Data: 57 year old female with acute onset right side weakness.  Hypertension diabetes.  MRI HEAD WITHOUT CONTRAST  Technique:  Multiplanar, multiecho pulse sequences of the brain and surrounding structures were obtained according to standard protocol without intravenous contrast.  Comparison: Head CT 1644 hours the same day.  Findings: Restricted diffusion in the left cerebellar hemisphere corresponding in part to the CT findings made earlier.  Associated T2 new and mild FLAIR hyperintensity with no mass effect or hemorrhage.  In addition, there is a tiny focus of restricted diffusion in the undersurface of the left occipital  lobe (series 3 image 10) with mild T2 hyperintensity.  At the left deep gray matter nuclei there is restricted diffusion in the lateral left thalamus bordering on or involving the posterior limb of the left internal capsule (series 3 image 14). However, this CT finding near the genu of the left internal capsule and superior left thalamus appears only mildly restricted, and has a greater degree of FLAIR hyperintensity.  No mass effect or hemorrhage associated with these.  No diffusion abnormality identified elsewhere.  The distal left vertebral artery flow void is lost (series 5 image 3 and adjacent images).  Other Major intracranial vascular flow voids are preserved.  However, there is conspicuous increased FLAIR signal and decreased T2* signal in the left PCA (series 6 image 9 and series 7 image 9).  No ventriculomegaly.  No midline shift. No acute intracranial hemorrhage identified.   Negative pituitary, cervicomedullary junction and visualized cervical spine. Visualized bone marrow signal is within normal limits.  Outside of the acute findings there is mild scattered mostly subcortical cerebral white matter T2 and FLAIR hyperintensity.  Visualized orbit soft tissues are within normal limits.  Visualized paranasal sinuses and mastoids are clear.  Negative scalp soft tissues.  IMPRESSION: 1.  Scattered small acute infarcts in the left cerebellum, inferior left occipital lobe, and lateral thalamus at or near the posterior limb of the  left internal capsule.  No mass effect or hemorrhage associated with these.  The largest individual lesion corresponds to that seen by CT in the cerebellum. 2.  The other CT lesion corresponds to a more subacute appearing lacunar infarct in the superior left thalamus nearer the genu of the internal capsule. 3.  Poor flow or occlusion of the distal left vertebral artery. Suspect poor flow or occlusion also at the left PCA.  Study discussed by telephone with Dr. Amada Jupiter on 1944 hours  at 07/28/2012.   Original Report Authenticated By: Erskine Speed, M.D.     Scheduled Meds:   . amLODipine  10 mg Oral Daily  . atorvastatin  20 mg Oral q1800  . enoxaparin (LOVENOX) injection  40 mg Subcutaneous Q24H  . influenza  inactive virus vaccine  0.5 mL Intramuscular Tomorrow-1000  . insulin aspart  0-9 Units Subcutaneous Q4H  . insulin detemir  40 Units Subcutaneous BID  . potassium chloride  20 mEq Oral BID   Continuous Infusions:   Principal Problem:  *CVA (cerebral vascular accident) Active Problems:  Stroke  Hypertension  Diabetes  Microscopic polyangiitis  Obesity  Hypokalemia    Skai Lickteig A, MD  [facility] Pager [phone]. If 7PM-7AM, please contact night-coverage at [email or web address], password [facility] 07/29/2012, 11:12 AM  LOS: 1 day

## 2012-07-29 NOTE — Care Management Note (Signed)
    Page 1 of 1   08/01/2012     8:38:44 AM   CARE MANAGEMENT NOTE 08/01/2012  Patient:  Sandy Davis, Sandy Davis   Account Number:  [id number]  Date Initiated:  07/29/2012  Documentation initiated by:  GRAVES-BIGELOW,BRENDA  Subjective/Objective Assessment:   Pt admitted with slurred speech and R side numbness. She was found to have acute infarcts of the left cerebellum and left inferior occipital lobe anda subacute lacunar infarct in the Superior Left Thalmus.     Action/Plan:   CM will continue to monitor for disposition needs.   Anticipated DC Date:  07/31/2012   Anticipated DC Plan:  IP REHAB FACILITY      DC Planning Services  CM consult      Choice offered to / List presented to:             Status of service:  Completed, signed off Medicare Important Message given?   (If response is "NO", the following Medicare IM given date fields will be blank) Date Medicare IM given:   Date Additional Medicare IM given:    Discharge Disposition:  IP REHAB FACILITY  Per UR Regulation:  Reviewed for med. necessity/level of care/duration of stay  If discussed at Long Length of Stay Meetings, dates discussed:    Comments:  07/30/2012 0940 Pt has referral for IP rehab, sent message to CIR RN for screening. Isidoro Donning RN CCM Case Mgmt phone [phone]

## 2012-07-29 NOTE — Progress Notes (Signed)
Taken 2300 not 2200

## 2012-07-29 NOTE — Progress Notes (Signed)
  Echocardiogram 2D Echocardiogram has been performed.  Sandy Davis 07/29/2012, 10:27 AM

## 2012-07-29 NOTE — Progress Notes (Signed)
Stroke Team Progress Note  HISTORY  Sandy Davis is a 57 y.o. female who was bending down earlier on 07/28/12 when she was seen to have whole body shaking. The patient remembers the episode and did not lose conciousness or awareness at any point during the episode. Following this, she had some slurred speech and per her friend was "dragging her right leg). She had double vision at the start of the episode, but this improved.   Time of onset: 2 - 2:30 pm on 07/28/12 (patient unsure)   tpa given: no, mild deficits, ? Seizure at onset.  NIHSS: 2  The patient was admitted for further evaluation and treatment.   SUBJECTIVE There are no family members at the bedside. The patient does not feel she's had a significant improvement in fact she feels that she's more aware of her deficits today. Her speech however has gotten some better although she still has some mild report finding difficulties and states that she has to think about but she is going to say before she speaks. Overall she states that she feels poorly and her whole right side feels heavy.  OBJECTIVE Most recent Vital Signs: Filed Vitals:   07/28/12 2300 07/29/12 0200 07/29/12 0417 07/29/12 0539  BP: 172/88 152/85 143/77 159/86  Pulse: 70 65 65 65  Temp: 98.1 F (36.7 C) 98.1 F (36.7 C) 97.9 F (36.6 C) 98 F (36.7 C)  TempSrc: Oral Oral Oral Oral  Resp: 18 18 18 18   SpO2: 97% 97% 99% 98%   CBG (last 3)   Basename 07/29/12 0625 07/29/12 0232 07/28/12 2254  GLUCAP 112* 113* 239*    IV Fluid Intake:     MEDICATIONS    . amLODipine  10 mg Oral Daily  . atorvastatin  20 mg Oral q1800  . enoxaparin (LOVENOX) injection  40 mg Subcutaneous Q24H  . influenza  inactive virus vaccine  0.5 mL Intramuscular Tomorrow-1000  . insulin aspart  0-9 Units Subcutaneous Q4H  . insulin detemir  40 Units Subcutaneous BID  . potassium chloride  20 mEq Oral BID   PRN:  hydrALAZINE, senna-docusate  Diet:  Carb Control Thin  liquids Activity:  Bedrest  DVT Prophylaxis:  Lovenox  CLINICALLY SIGNIFICANT STUDIES Basic Metabolic Panel:  Lab 07/28/12 1610  NA 137  K 3.4*  CL 101  CO2 26  GLUCOSE 122*  BUN 13  CREATININE 0.79  CALCIUM 10.9*  MG --  PHOS --   Liver Function Tests:  Lab 07/28/12 1645  AST 15  ALT 16  ALKPHOS 103  BILITOT 0.2*  PROT 8.0  ALBUMIN 4.0   CBC:  Lab 07/28/12 1645  WBC 9.7  NEUTROABS 5.4  HGB 12.8  HCT 38.4  MCV 92.1  PLT 262   Coagulation:  Lab 07/28/12 1645  LABPROT 13.1  INR 1.00   Cardiac Enzymes:  Lab 07/28/12 1645  CKTOTAL --  CKMB --  CKMBINDEX --  TROPONINI <0.30   Urinalysis:  Lab 07/28/12 1747  COLORURINE YELLOW  LABSPEC 1.012  PHURINE 6.5  GLUCOSEU NEGATIVE  HGBUR NEGATIVE  BILIRUBINUR NEGATIVE  KETONESUR NEGATIVE  PROTEINUR NEGATIVE  UROBILINOGEN 0.2  NITRITE NEGATIVE  LEUKOCYTESUR SMALL*   Lipid Panel    Component Value Date/Time   CHOL 146 07/29/2012 0550   TRIG 92 07/29/2012 0550   HDL 47 07/29/2012 0550   CHOLHDL 3.1 07/29/2012 0550   VLDL 18 07/29/2012 0550   LDLCALC 81 07/29/2012 0550   HgbA1C  No results found for this  basename: HGBA1C    Urine Drug Screen:     Component Value Date/Time   LABOPIA NONE DETECTED 07/28/2012 1747   COCAINSCRNUR NONE DETECTED 07/28/2012 1747   LABBENZ NONE DETECTED 07/28/2012 1747   AMPHETMU NONE DETECTED 07/28/2012 1747   THCU NONE DETECTED 07/28/2012 1747   LABBARB NONE DETECTED 07/28/2012 1747    Alcohol Level: No results found for this basename: ETH:2 in the last 168 hours  Dg Chest 2 View 07/28/2012 No acute cardiopulmonary process seen.      CT HEAD WITHOUT CONTRAST   07/28/2012  1. Small subacute versus chronic left cerebellar and left internal capsule infarcts.  No mass effect or hemorrhage. 2.  No other acute intracranial abnormality identified.   Mr Brain Wo Contrast 07/28/2012  1.  Scattered small acute infarcts in the left cerebellum, inferior left occipital lobe, and lateral  thalamus at or near the posterior limb of the left internal capsule.  No mass effect or hemorrhage associated with these.  The largest individual lesion corresponds to that seen by CT in the cerebellum. 2.  The other CT lesion corresponds to a more subacute appearing lacunar infarct in the superior left thalamus nearer the genu of the internal capsule. 3.  Poor flow or occlusion of the distal left vertebral artery. Suspect poor flow or occlusion also at the left PCA..     2D Echocardiogram  - Pending  Carotid Doppler  - Pending  EKG  sinus rhythm rate 64 beats per minute  Therapy Recommendations - Pending  Physical Exam   General - Pleasant 57 year old female in bed in no acute distress. Heart - Regular rate and rhythm - no murmer noted. Lungs - Clear to auscultation Skin - Warm and dry  Mental Status: Alert, oriented, thought content appropriate.  Mild report finding difficulties.  Able to follow 3 step commands without difficulty. Cranial Nerves: MV:HQIONG fields grossly normal, pupils equal, round, reactive to light and accommodation III,IV, VI: ptosis not present, extra-ocular motions intact bilaterally V,VII: smile symmetric, facial light touch sensation normal bilaterally VIII: hearing normal bilaterally IX,X: gag reflex present XI: bilateral shoulder shrug XII: midline tongue extension Motor: Right : Upper extremity   4/5 with drift   Left:     Upper extremity   5/5  Lower extremity   3/5     Lower extremity   5/5 Tone and bulk:normal tone throughout; no atrophy noted Sensory: Decrease sensation to light touch on the right upper and lower extremities. Deep Tendon Reflexes: 2+ and symmetric throughout Plantars: Right: downgoing   Left: downgoing Cerebellar: Finger to nose testing was performed with some difficulty on the right possibly due to weakness.    ASSESSMENT Sandy Davis is a 57 y.o. female presenting with dysarthria and right  Hemiparesis. TPA was not  given due due to minimal deficits. Imaging confirms scattered small acute infarcts in the left cerebellum, inferior left occipital lobe, and lateral thalamus Infarcts felt to be embolic. Poor flow or occlusion of the distal left vertebral artery. On no anticoagulants prior to admission. Now on aspirin 324 mg daily. Patient with resultant mild aphasia and right hemiparesis. Work up underway. Cholesterol 146 LDL 81 - on Zocor prior to admission Diabetes mellitus Hypertension  Hospital day # 1  TREATMENT/PLAN  Continue aspirin 324 mg daily for secondary stroke prevention.  Continue Zocor  Diabetes mellitus - await hemoglobin A1 C  Hypertension-continue amlodipine - blood pressure high times-there is a when necessary order for hydralazine.  Await results  of 2-D echo  Await results of carotid Dopplers  Await therapist evaluations  Delton See PA-C [facility] Pager [phone] 07/29/2012, 7:49 AM  I have personally obtained a history, examined the patient, evaluated imaging results, and formulated the assessment and plan of care. I agree with the above.  -  Pt has hx of PAN which could be contributing to her strokes - Stroke work up - ASA 325 not on anti platelet at home, statin - Multiple L sided strokes, if TTE negative, then TEE - hx of AA thoracic aneurism which is not followed, please obtain CT abd. to reconfirm size - hypercoagulable profile.   - tight glycemic control.

## 2012-07-29 NOTE — Evaluation (Signed)
Occupational Therapy Evaluation Patient Details Name: Sandy Davis MRN: [id number] DOB: 1956/04/05 Today's Date: 07/29/2012 Time: 4098-1191 OT Time Calculation (min): 31 min  OT Assessment / Plan / Recommendation Clinical Impression  Pt admitted with right sided weakness. MRI shows Scattered small acute infarcts in the left cerebellum, inferior left occipital lobe, and lateral thalamus at or near the posterior limb of the left internal capsule. Pt will benefit from acute OT services to address below problem list.  Recommending CIR  to maximize independence and safety with ADLs in prep for return home.     OT Assessment  Patient needs continued OT Services    Follow Up Recommendations  CIR    Barriers to Discharge      Equipment Recommendations   (tbd)    Recommendations for Other Services Rehab consult  Frequency  Min 3X/week    Precautions / Restrictions Precautions Precautions: Fall   Pertinent Vitals/Pain See vitals    ADL  Grooming: Performed;Brushing hair;Minimal assistance Where Assessed - Grooming: Unsupported sitting Lower Body Dressing: Performed;Supervision/safety (donned socks) Where Assessed - Lower Body Dressing: Unsupported sitting Toilet Transfer: Simulated;Moderate assistance Toilet Transfer Method: Sit to Barista:  (bed) Equipment Used: Gait belt Transfers/Ambulation Related to ADLs: min assist for sit<>stand, mod assist to control descent to bed. ADL Comments: Pt able to don/doff bil socks sitting EOB at supervision level with increased time due to ataxic movement in RUE.      OT Diagnosis: Paresis  OT Problem List: Decreased activity tolerance;Impaired balance (sitting and/or standing);Decreased strength;Decreased coordination;Decreased knowledge of use of DME or AE;Impaired sensation;Impaired UE functional use OT Treatment Interventions: Self-care/ADL training;Therapeutic exercise;DME and/or AE instruction;Therapeutic  activities;Patient/family education;Balance training   OT Goals Acute Rehab OT Goals OT Goal Formulation: With patient Time For Goal Achievement: 08/12/12 Potential to Achieve Goals: Good ADL Goals Pt Will Perform Grooming: with modified independence;Standing at sink ADL Goal: Grooming - Progress: Goal set today Pt Will Perform Upper Body Bathing: with modified independence;Sitting, edge of bed;Sitting, chair ADL Goal: Upper Body Bathing - Progress: Goal set today Pt Will Perform Lower Body Bathing: with modified independence;Sit to stand from chair;Sit to stand from bed ADL Goal: Lower Body Bathing - Progress: Goal set today Pt Will Perform Upper Body Dressing: with modified independence;Sitting, chair;Sitting, bed ADL Goal: Upper Body Dressing - Progress: Goal set today Pt Will Perform Lower Body Dressing: with modified independence;Sit to stand from chair;Sit to stand from bed ADL Goal: Lower Body Dressing - Progress: Goal set today Pt Will Transfer to Toilet: with modified independence;Ambulation;with DME;Comfort height toilet ADL Goal: Toilet Transfer - Progress: Goal set today Pt Will Perform Toileting - Clothing Manipulation: with modified independence;Standing ADL Goal: Toileting - Clothing Manipulation - Progress: Goal set today Pt Will Perform Toileting - Hygiene: with modified independence;Sit to stand from 3-in-1/toilet ADL Goal: Toileting - Hygiene - Progress: Goal set today Arm Goals Additional Arm Goal #1: Pt will independently perform RUE fine motor HEP 2-3 x daily. Arm Goal: Additional Goal #1 - Progress: Goal set today Miscellaneous OT Goals Miscellaneous OT Goal #1: Pt will perform all functional mobility at mod I level. OT Goal: Miscellaneous Goal #1 - Progress: Goal set today Miscellaneous OT Goal #2: Pt will perform static standing balance task >5 min at mod I level with no LOB. OT Goal: Miscellaneous Goal #2 - Progress: Goal set today  Visit Information  Last  OT Received On: 07/29/12 Assistance Needed: +2    Subjective Data  Subjective: "It just  feels so numb" (referring to right arm and leg)   Prior Functioning     Home Living Lives With: Family;Daughter (grandchildren 7 and 8) Available Help at Discharge: Family;Available 24 hours/day (daughter is home during day) Type of Home: House Home Access: Stairs to enter [employer] of Steps: 3 Entrance Stairs-Rails: None Home Layout: One level Bathroom Shower/Tub: Health visitor: Standard Home Adaptive Equipment: None Prior Function Level of Independence: Independent Driving: Yes Dominant Hand: Right         Vision/Perception Vision - Assessment Vision Assessment: Vision tested Tracking/Visual Pursuits: Able to track stimulus in all quads without difficulty Saccades: Within functional limits Visual Fields: No apparent deficits Additional Comments: Pt reports initially she experienced blurring of vision with onset of symptoms but reports she feels vision is currently at baseline.   Cognition  Overall Cognitive Status: Appears within functional limits for tasks assessed/performed Arousal/Alertness: Awake/alert Orientation Level: Appears intact for tasks assessed Behavior During Session: [facility] for tasks performed    Extremity/Trunk Assessment Right Upper Extremity Assessment RUE ROM/Strength/Tone: Deficits RUE ROM/Strength/Tone Deficits: 3+/5 throughout RUE Sensation: Deficits RUE Sensation Deficits: Able to detect light touch.  Reports numbness throughout RUE. RUE Coordination: Deficits RUE Coordination Deficits: Ataxic movement during gross motor tasks. Increased time and effort during fine motor tasks. Left Upper Extremity Assessment LUE ROM/Strength/Tone: Within functional levels LUE Sensation: WFL - Light Touch;WFL - Proprioception LUE Coordination: WFL - gross/fine motor     Mobility Bed Mobility Bed Mobility: Supine to Sit;Sitting - Scoot to  Edge of Bed;Sit to Supine Supine to Sit: 5: Supervision Sitting - Scoot to Edge of Bed: 5: Supervision Sit to Supine: 5: Supervision Transfers Transfers: Sit to Stand;Stand to Sit Sit to Stand: 4: Min assist;With upper extremity assist;From bed Stand to Sit: 3: Mod assist;To bed;With upper extremity assist Details for Transfer Assistance: Assist for steadying and to control descent to bed.     Shoulder Instructions     Exercise     Balance Balance Balance Assessed: Yes Static Sitting Balance Static Sitting - Balance Support: Feet supported;No upper extremity supported Static Sitting - Level of Assistance: 5: Stand by assistance Static Sitting - Comment/# of Minutes: 10 minutes Static Standing Balance Static Standing - Balance Support: Bilateral upper extremity supported Static Standing - Level of Assistance: 3: Mod assist Static Standing - Comment/# of Minutes: 1 minute. Required steadying and assist to block right knee.   End of Session OT - End of Session Activity Tolerance: Patient tolerated treatment well Patient left: in bed;with call bell/phone within reach;with bed alarm set;with family/visitor present Nurse Communication: Mobility status  GO   07/29/2012 Cipriano Mile OTR/L Pager [phone] Office [phone]   Cipriano Mile 07/29/2012, 3:45 PM

## 2012-07-29 NOTE — Progress Notes (Signed)
Patient bp elevated with order for PRN for sbp>160, however neurologist Dr. Amada Jupiter note recommends allowing permissive hypertension of sbp >220 and dbp >120. BP 172/88 HR 70. No PRN given at this time, will continue to monitor pt.  Olevia Perches

## 2012-07-29 NOTE — Evaluation (Signed)
Physical Therapy Evaluation Patient Details Name: Sandy Davis MRN: [id number] DOB: May 21, 1956 Today's Date: 07/29/2012 Time: 1324-4010 PT Time Calculation (min): 35 min  PT Assessment / Plan / Recommendation Clinical Impression  57 yo presents with significantly limited sensation on dominant side resulting in severe impairment to mobility.  Needs moderate to maximal assist to ambulate short distances and frequent cues for safety.  Highly recommend inpatient rehab venue for post acute therapy needs.    PT Assessment  Patient needs continued PT services    Follow Up Recommendations  CIR    Does the patient have the potential to tolerate intense rehabilitation      Barriers to Discharge        Equipment Recommendations  Rolling walker with 5" wheels;Cane    Recommendations for Other Services Rehab consult   Frequency Min 4X/week    Precautions / Restrictions Precautions Precautions: Fall   Pertinent Vitals/Pain       Mobility  Bed Mobility Bed Mobility: Supine to Sit;Sitting - Scoot to [place] of Bed;Sit to Supine Supine to Sit: 5: Supervision Sitting - Scoot to Edge of Bed: 5: Supervision Sit to Supine: 5: Supervision Details for Bed Mobility Assistance: pt up at EOB and chose to remain upright Transfers Transfers: Sit to Stand;Stand to Sit Sit to Stand: 3: Mod assist;With upper extremity assist;From bed Stand to Sit: 3: Mod assist;With upper extremity assist;To bed Details for Transfer Assistance: assist to remain aligned when coming to stand as tends to rotate to intact side and grasp for furniture.  Trunk instability in addition to impaired sensation result in need for hands on cues to stabilize trunk Ambulation/Gait Ambulation/Gait Assistance: 2: Max assist Ambulation Distance (Feet): 10 Feet (x 2) Assistive device: 1 person hand held assist Ambulation/Gait Assistance Details: guard from right side under arm with faciliation to sternum and pelvis for trunk  stability.  pt uses hip flexor to slide right leg forward, locking leg in extension.  hops left leg forward and needs physical assistance to prevent traumatic loss of balance. Gait Pattern: Decreased step length - right;Decreased stance time - right;Decreased hip/knee flexion - right;Decreased dorsiflexion - right;Decreased weight shift to right;Right foot flat;Decreased trunk rotation Gait velocity: not tested in small space General Gait Details: see above. Stairs: No Wheelchair Mobility Wheelchair Mobility: No Modified Rankin (Stroke Patients Only) Pre-Morbid Rankin Score: No symptoms Modified Rankin: Moderately severe disability    Shoulder Instructions     Exercises General Exercises - Lower Extremity Ankle Circles/Pumps: AROM;Both;10 reps;Seated Quad Sets: AROM;Both;10 reps;Seated Long Arc Quad: AROM;Both;10 reps;Seated   PT Diagnosis: Hemiplegia dominant side  PT Problem List: Decreased strength;Decreased activity tolerance;Decreased balance;Decreased mobility;Impaired sensation;Decreased knowledge of use of DME PT Treatment Interventions: DME instruction;Gait training;Functional mobility training;Therapeutic activities;Therapeutic exercise;Balance training;Neuromuscular re-education;Patient/family education   PT Goals Acute Rehab PT Goals PT Goal Formulation: With patient Time For Goal Achievement: 08/05/12 Potential to Achieve Goals: Good Pt will go Supine/Side to Sit: with min assist PT Goal: Supine/Side to Sit - Progress: Goal set today Pt will go Sit to Supine/Side: with min assist PT Goal: Sit to Supine/Side - Progress: Goal set today Pt will go Sit to Stand: with min assist PT Goal: Sit to Stand - Progress: Goal set today Pt will go Stand to Sit: with min assist PT Goal: Stand to Sit - Progress: Goal set today Pt will Ambulate: 16 - 50 feet;with mod assist;with least restrictive assistive device PT Goal: Ambulate - Progress: Goal set today  Visit Information  Last  PT  Received On: 07/29/12 Assistance Needed: +2    Subjective Data  Subjective: what can i do to get better fast?   Prior Functioning  Home Living Lives With: Family;Daughter Available Help at Discharge: Family;Available 24 hours/day Type of Home: House Home Access: Stairs to enter [employer] of Steps: 3 Entrance Stairs-Rails: None Home Layout: One level Bathroom Shower/Tub: Health visitor: Standard Home Adaptive Equipment: None Prior Function Level of Independence: Independent Able to Take Stairs?: Yes Driving: Yes Communication Communication: No difficulties Dominant Hand: Right    Cognition  Overall Cognitive Status: Appears within functional limits for tasks assessed/performed Arousal/Alertness: Awake/alert Orientation Level: Appears intact for tasks assessed Behavior During Session: [facility] for tasks performed    Extremity/Trunk Assessment Right Upper Extremity Assessment RUE ROM/Strength/Tone: Deficits RUE ROM/Strength/Tone Deficits: 3+/5 throughout RUE Sensation: Deficits RUE Sensation Deficits: Able to detect light touch.  Reports numbness throughout RUE. RUE Coordination: Deficits RUE Coordination Deficits: Ataxic movement during gross motor tasks. Increased time and effort during fine motor tasks. Left Upper Extremity Assessment LUE ROM/Strength/Tone: Within functional levels LUE Sensation: WFL - Light Touch;WFL - Proprioception LUE Coordination: WFL - gross/fine motor Right Lower Extremity Assessment RLE ROM/Strength/Tone: Deficits RLE ROM/Strength/Tone Deficits: impaired motor control with delayed initiation of contraction RLE Sensation: Deficits RLE Sensation Deficits: light touch RLE Coordination: WFL - gross motor (heel to shin test) Left Lower Extremity Assessment LLE ROM/Strength/Tone: Within functional levels   Balance Balance Balance Assessed: Yes Static Sitting Balance Static Sitting - Balance Support: Feet supported;No  upper extremity supported Static Sitting - Level of Assistance: 5: Stand by assistance Static Sitting - Comment/# of Minutes: 10 minutes Static Standing Balance Static Standing - Balance Support: Bilateral upper extremity supported Static Standing - Level of Assistance: 3: Mod assist Static Standing - Comment/# of Minutes: 1 minute. Required steadying and assist to block right knee.  End of Session PT - End of Session Activity Tolerance: Patient limited by fatigue;Patient tolerated treatment well Patient left: in bed;with call bell/phone within reach;with family/visitor present Nurse Communication: Mobility status  GP     Dennis Bast 07/29/2012, 5:16 PM

## 2012-07-29 NOTE — Progress Notes (Signed)
UR Completed Meer Reindl Graves-Bigelow, RN,BSN [phone]  

## 2012-07-30 ENCOUNTER — Inpatient Hospital Stay (HOSPITAL_COMMUNITY): Payer: PRIVATE HEALTH INSURANCE

## 2012-07-30 DIAGNOSIS — I10 Essential (primary) hypertension: Secondary | ICD-10-CM

## 2012-07-30 DIAGNOSIS — E119 Type 2 diabetes mellitus without complications: Secondary | ICD-10-CM

## 2012-07-30 LAB — GLUCOSE, CAPILLARY
Glucose-Capillary: 77 mg/dL (ref 70–99)
Glucose-Capillary: 84 mg/dL (ref 70–99)
Glucose-Capillary: 152 mg/dL — ABNORMAL HIGH (ref 70–99)
Glucose-Capillary: 152 mg/dL — ABNORMAL HIGH (ref 70–99)

## 2012-07-30 LAB — SEDIMENTATION RATE: Sed Rate: 18 mm/hr (ref 0–22)

## 2012-07-30 LAB — BASIC METABOLIC PANEL
CO2: 26 mEq/L (ref 19–32)
Chloride: 103 mEq/L (ref 96–112)
Sodium: 139 mEq/L (ref 135–145)

## 2012-07-30 LAB — CARDIOLIPIN ANTIBODIES, IGG, IGM, IGA: Anticardiolipin IgG: 3 GPL U/mL — ABNORMAL LOW (ref <23)

## 2012-07-30 LAB — CBC
Hemoglobin: 12.4 g/dL (ref 12.0–15.0)
Platelets: 285 10*3/uL (ref 150–400)
RBC: 3.96 MIL/uL (ref 3.87–5.11)
WBC: 6.7 10*3/uL (ref 4.0–10.5)

## 2012-07-30 LAB — RPR: RPR Ser Ql: NONREACTIVE

## 2012-07-30 LAB — BETA-2-GLYCOPROTEIN I ABS, IGG/M/A
Beta-2-Glycoprotein I IgA: 13 A Units (ref <20)
Beta-2-Glycoprotein I IgM: 13 M Units (ref <20)

## 2012-07-30 LAB — LUPUS ANTICOAGULANT PANEL: Lupus Anticoagulant: NOT DETECTED

## 2012-07-30 LAB — PROTEIN C, TOTAL: Protein C, Total: 92 % (ref 72–160)

## 2012-07-30 LAB — PROTEIN S ACTIVITY: Protein S Activity: 94 % (ref 69–129)

## 2012-07-30 LAB — PROTEIN S, TOTAL: Protein S Ag, Total: 93 % (ref 60–150)

## 2012-07-30 LAB — HEMOGLOBIN A1C: Hgb A1c MFr Bld: 6.3 % — ABNORMAL HIGH (ref <5.7)

## 2012-07-30 LAB — C4 COMPLEMENT: Complement C4, Body Fluid: 36 mg/dL (ref 10–40)

## 2012-07-30 LAB — C3 COMPLEMENT: C3 Complement: 141 mg/dL (ref 90–180)

## 2012-07-30 MED ORDER — INSULIN ASPART 100 UNIT/ML ~~LOC~~ SOLN
0.0000 [IU] | Freq: Three times a day (TID) | SUBCUTANEOUS | Status: DC
  Administered 2012-07-30 – 2012-07-31 (×2): 2 [IU] via SUBCUTANEOUS

## 2012-07-30 NOTE — Progress Notes (Signed)
Stroke Team Progress Note  HISTORY  Sandy Davis is a 57 y.o. female who was bending down earlier on 07/28/12 when she was seen to have whole body shaking. The patient remembers the episode and did not lose conciousness or awareness at any point during the episode. Following this, she had some slurred speech and per her friend was "dragging her right leg). She had double vision at the start of the episode, but this improved. She was not a TPA candidate secondary to mild deficits, ? Seizure at onset. The patient was admitted for further evaluation and treatment.  SUBJECTIVE Patient up in chair at bedside. Patient reports right body numbness, dizziness has resolved.  OBJECTIVE Most recent Vital Signs: Filed Vitals:   07/29/12 2200 07/30/12 0200 07/30/12 0600 07/30/12 0940  BP: 152/96 135/75 134/86 139/88  Pulse: 82 68 79 75  Temp: 98.2 F (36.8 C) 98 F (36.7 C) 98.7 F (37.1 C) 98.6 F (37 C)  TempSrc: Oral Oral Oral Oral  Resp: 18 18 18 18   SpO2: 96% 97% 98% 100%   CBG (last 3)  Basename 07/30/12 0916 07/30/12 0347 07/29/12 2348  GLUCAP 136* 77 84   IV Fluid Intake:     MEDICATIONS    . amLODipine  10 mg Oral Daily  . aspirin  325 mg Oral Daily  . atorvastatin  20 mg Oral q1800  . enoxaparin (LOVENOX) injection  40 mg Subcutaneous Q24H  . insulin aspart  0-9 Units Subcutaneous Q4H  . insulin detemir  40 Units Subcutaneous BID  . potassium chloride  20 mEq Oral BID   PRN:  acetaminophen, hydrALAZINE, senna-docusate  Diet:  Carb Control Thin liquids Activity:  Ambulate with assistance, OOB with assistance and Bedrest  DVT Prophylaxis:  Lovenox  CLINICALLY SIGNIFICANT STUDIES Basic Metabolic Panel:   Lab 07/30/12 0635 07/28/12 1645  NA 139 137  K 3.7 3.4*  CL 103 101  CO2 26 26  GLUCOSE 72 122*  BUN 12 13  CREATININE 0.66 0.79  CALCIUM 10.4 10.9*  MG -- --  PHOS -- --   Liver Function Tests:   Lab 07/28/12 1645  AST 15  ALT 16  ALKPHOS 103  BILITOT 0.2*    PROT 8.0  ALBUMIN 4.0   CBC:   Lab 07/30/12 0635 07/28/12 1645  WBC 6.7 9.7  NEUTROABS -- 5.4  HGB 12.4 12.8  HCT 36.4 38.4  MCV 91.9 92.1  PLT 285 262   Coagulation:   Lab 07/28/12 1645  LABPROT 13.1  INR 1.00   Cardiac Enzymes:   Lab 07/28/12 1645  CKTOTAL --  CKMB --  CKMBINDEX --  TROPONINI <0.30   Urinalysis:   Lab 07/28/12 1747  COLORURINE YELLOW  LABSPEC 1.012  PHURINE 6.5  GLUCOSEU NEGATIVE  HGBUR NEGATIVE  BILIRUBINUR NEGATIVE  KETONESUR NEGATIVE  PROTEINUR NEGATIVE  UROBILINOGEN 0.2  NITRITE NEGATIVE  LEUKOCYTESUR SMALL*   Lipid Panel    Component Value Date/Time   CHOL 146 07/29/2012 0550   TRIG 92 07/29/2012 0550   HDL 47 07/29/2012 0550   CHOLHDL 3.1 07/29/2012 0550   VLDL 18 07/29/2012 0550   LDLCALC 81 07/29/2012 0550   HgbA1C  No results found for this basename: HGBA1C   Urine Drug Screen:     Component Value Date/Time   LABOPIA NONE DETECTED 07/28/2012 1747   COCAINSCRNUR NONE DETECTED 07/28/2012 1747   LABBENZ NONE DETECTED 07/28/2012 1747   AMPHETMU NONE DETECTED 07/28/2012 1747   THCU NONE DETECTED 07/28/2012 1747  LABBARB NONE DETECTED 07/28/2012 1747    Alcohol Level: No results found for this basename: ETH:2 in the last 168 hours  Normal - anti III, homocysteine Pending - Prot C, Prot S, lupus, [id number], cardiolipin abx,   CT HEAD WITHOUT CONTRAST  07/28/2012 1. Small subacute versus chronic left cerebellar and left internal capsule infarcts.  No mass effect or hemorrhage. 2.  No other acute intracranial abnormality identified.   MRI Brain 07/28/2012  1.  Scattered small acute infarcts in the left cerebellum, inferior left occipital lobe, and lateral thalamus at or near the posterior limb of the left internal capsule.  No mass effect or hemorrhage associated with these.  The largest individual lesion corresponds to that seen by CT in the cerebellum. 2.  The other CT lesion corresponds to a more subacute appearing lacunar infarct in the  superior left thalamus nearer the genu of the internal capsule. 3.  Poor flow or occlusion of the distal left vertebral artery. Suspect poor flow or occlusion also at the left PCA..   MRA Brain    2D Echocardiogram   Carotid Doppler  No evidence of hemodynamically significant internal carotid artery stenosis. Vertebral artery flow is antegrade. Incidental finding: A solid, non-vascularthyroid mass (right lobe) measuring 1.59x 1.44 cm. Kindly correlate with thyroid ultrasound.  EKG  sinus rhythm rate 64 beats per minute  Dg Chest 2 View 1/25/2014No acute cardiopulmonary process seen.     CT angio of chest   Therapy Recommendations - CIR  Physical Exam   General - Pleasant 57 year old female in bed in no acute distress. Heart - Regular rate and rhythm - no murmer noted. Lungs - Clear to auscultation Skin - Warm and dry  Mental Status: Alert, oriented, thought content appropriate.  Mild report finding difficulties.  Able to follow 3 step commands without difficulty. Cranial Nerves: WG:NFAOZH fields grossly normal, pupils equal, round, reactive to light and accommodation III,IV, VI: ptosis not present, extra-ocular motions intact bilaterally with mild exotropia right eye but no dipolpia V,VII: smile symmetric, facial light touch sensation normal bilaterally VIII: hearing normal bilaterally IX,X: gag reflex present XI: bilateral shoulder shrug XII: midline tongue extension Motor: Right : Upper extremity   4/5 with drift   Left:     Upper extremity   5/5  Lower extremity   3/5     Lower extremity   5/5 Tone and bulk:normal tone throughout; no atrophy noted. Diminished fine finger movements on left and orbits right over left upper extremity Sensory: Decrease sensation to light touch on the right upper and lower extremities. Deep Tendon Reflexes: 2+ and symmetric throughout Plantars: Right: downgoing   Left: downgoing Cerebellar: Finger to nose testing was performed with some  difficulty on the right possibly due to weakness.   ASSESSMENT [name] is a 57 y.o. female presenting with dysarthria and right  Hemiparesis. Imaging confirms scattered small acute infarcts in the left cerebellum, inferior left occipital lobe, and lateral thalamus. Infarcts felt to be embolic. On no anticoagulants prior to admission. Now on aspirin 325 mg daily. Patient with resultant right hemisensory deficit, right hemiparesis, and ataxia. Work up underway.   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 recommended to reconfirm size Hyperlipidemia, LDL 81, on statin PTA, on lipitor now, at goal LDL < 100 Hx of periarteritis nodosa which could  be contributing to her strokes  Right nonvascular thyroid mass, recommend u/s followup  Obesity  Hospital day # 2  TREATMENT/PLAN  Continue aspirin 325 mg daily for secondary stroke prevention.  Continue zocor  F/u hemoglobin A1 C  F/u 2-D echo TEE to look for embolic source. Arranged with [facility] for tomorrow.  If positive for PFO (patent foramen ovale), check bilateral lower extremity venous dopplers to rule out DVT as possible source of stroke.  CIR consult pending  thyroid ultrasound of right lobe mass (I ordered along with thyroid labs) Ongoing aggressive risk factor control by Primary MD F/u hypercoagulable profile.   vasculitic labs, HIV, RPR TCD to look at intracranial vasculature  Dr. Pearlean Brownie discussed dx, prognosis and plan of care with patient and Dr. Betti Cruz.  Annie Main, MSN, RN, ANVP-BC, ANP-BC, [facility] Pager: [phone] 07/30/2012 10:01 AM  I have personally obtained a history, examined the patient, evaluated imaging results, and formulated the assessment and plan of care. I agree with the above. Delia Heady, MD Medical  Director [facility] Pager: [phone] 07/30/2012 2:05 PM

## 2012-07-30 NOTE — Progress Notes (Signed)
Physical Therapy Treatment Patient Details Name: Sandy Davis MRN: [id number] DOB: Oct 03, 1955 Today's Date: 07/30/2012 Time: 1191-4782 PT Time Calculation (min): 16 min  PT Assessment / Plan / Recommendation Comments on Treatment Session  Pt reports increased numbness today however, she was able to progress with mobility.  PT Goals updated at this time.      Follow Up Recommendations  CIR     Does the patient have the potential to tolerate intense rehabilitation     Barriers to Discharge        Equipment Recommendations  Rolling walker with 5" wheels;Cane    Recommendations for Other Services Rehab consult  Frequency Min 4X/week   Plan Discharge plan remains appropriate    Precautions / Restrictions Precautions Precautions: Fall       Mobility  Bed Mobility Bed Mobility: Not assessed Transfers Transfers: Sit to Stand;Stand to Sit Sit to Stand: 4: Min assist;With upper extremity assist;With armrests;From chair/3-in-1 Stand to Sit: 4: Min assist;With upper extremity assist;With armrests;To chair/3-in-1 Details for Transfer Assistance: (A) for balance & safety.   Ambulation/Gait Ambulation/Gait Assistance: 4: Min assist Ambulation Distance (Feet): 120 Feet Assistive device: Rolling walker Ambulation/Gait Assistance Details: Cues for increased hip/knee flexion Rt LE-- swings Rt LE forwards with knee in extension.   Gait Pattern: Decreased hip/knee flexion - right;Decreased weight shift to right;Right foot flat;Narrow base of support Stairs: No Wheelchair Mobility Wheelchair Mobility: No Modified Rankin (Stroke Patients Only) Pre-Morbid Rankin Score: No symptoms Modified Rankin: Moderately severe disability    Exercises General Exercises - Lower Extremity Ankle Circles/Pumps: AROM;Both;10 reps;Seated Quad Sets: AROM;Both;10 reps;Seated Long Arc Quad: AROM;Both;10 reps;Seated Hip Flexion/Marching: AROM;Strengthening;10 reps;Both    PT Goals Acute Rehab PT  Goals Time For Goal Achievement: 08/05/12 Potential to Achieve Goals: Good Pt will go Supine/Side to Sit: with min assist Pt will go Sit to Supine/Side: with min assist Pt will go Sit to Stand: with modified independence;with upper extremity assist PT Goal: Sit to Stand - Progress: Updated due to goal met Pt will go Stand to Sit: with modified independence;with upper extremity assist PT Goal: Stand to Sit - Progress: Updated due to goals met Pt will Ambulate: with least restrictive assistive device;51 - 150 feet;with modified independence PT Goal: Ambulate - Progress: Updated due to goal met  Visit Information  Last PT Received On: 07/30/12 Assistance Needed: +1    Subjective Data  Subjective: "Im more numb today"   Cognition  Overall Cognitive Status: Appears within functional limits for tasks assessed/performed Arousal/Alertness: Awake/alert Orientation Level: Appears intact for tasks assessed Behavior During Session: [facility] for tasks performed    Balance     End of Session PT - End of Session Equipment Utilized During Treatment: Gait belt Activity Tolerance: Patient tolerated treatment well Patient left: in chair;with call bell/phone within reach Nurse Communication: Mobility status     Verdell Face, [place] [phone] 07/30/2012

## 2012-07-30 NOTE — Progress Notes (Signed)
[facility] PROGRESS NOTE  Sandy Davis YQM:[id number] DOB: 1956-01-30 DOA: 07/28/2012 PCP: Geraldo Pitter, MD  Assessment/Plan: Scattered small acute infarcts in the left cerebellum, inferior left occipital lobe, and lateral thalamus at or near the posterior limb of the left internal capsule 2-D echocardiogram done results pending.  Transesophageal echocardiogram for tomorrow.  Continue aspirin 325 mg daily.  Continue carotid Dopplers.  Continue PT/OT/speech therapy.  Appreciate neurology evaluation.  Hypercoagulability workup sent by neurology which included cardiolipin antibody, factor V Leiden, beta-2 glycoprotein i abs, lupus anticoagulant panel, protein C, protein S. Homocystine and anti-thrombin III negative.  HIV, RPR, ANA, C3 and C4 complement levels pending.  LDL 81, simvastatin changed atorvastatin.  Hemoglobin A1c pending.  TEE tomorrow.  Hypertension Continue home antihypertensive medications.  Diabetes type 2 uncontrolled with complications Sliding scale insulin.  Diabetic diet.  Continue levemir 40 units SQ daily.  Metformin held.  Hypokalemia Replace as needed.  Obesity Diet and exercise as outpatient.  Hyperlipidemia LDL 81, continue statin.  Right neck mass TSH, free T4, and T3 ordered.  Ultrasound of the neck ordered.  Prophylaxis Lovenox.  Code Status: Full code. Family Communication: No family at bedside.  Disposition Plan: Pending.  CIR versus SNF.  Consultants:  Neurology  Procedures: Carotid Dopplers on 07/29/2012 Bilateral: No evidence of hemodynamically significant internal carotid artery stenosis. Vertebral artery flow is antegrade.   2-D echocardiogram 07/29/2012 Pending.  Antibiotics:  None.  HPI/Subjective: Denies any pain.  About the same as yesterday.  Objective: Filed Vitals:   07/29/12 2200 07/30/12 0200 07/30/12 0600 07/30/12 0940  BP: 152/96 135/75 134/86 139/88  Pulse: 82 68 79 75  Temp: 98.2 F (36.8 C) 98 F (36.7  C) 98.7 F (37.1 C) 98.6 F (37 C)  TempSrc: Oral Oral Oral Oral  Resp: 18 18 18 18   SpO2: 96% 97% 98% 100%    Intake/Output Summary (Last 24 hours) at 07/30/12 1055 Last data filed at 07/30/12 0900  Gross per 24 hour  Intake    960 ml  Output      0 ml  Net    960 ml   There were no vitals filed for this visit.  Exam: Physical Exam: General: Awake, Oriented, No acute distress. HEENT: EOMI. Neck: Supple CV: S1 and S2 Lungs: Clear to ascultation bilaterally Abdomen: Soft, Nontender, Nondistended, +bowel sounds. Ext: Good pulses. Trace edema.  Data Reviewed: Basic Metabolic Panel:  Lab 07/30/12 5284 07/28/12 1645  NA 139 137  K 3.7 3.4*  CL 103 101  CO2 26 26  GLUCOSE 72 122*  BUN 12 13  CREATININE 0.66 0.79  CALCIUM 10.4 10.9*  MG -- --  PHOS -- --   Liver Function Tests:  Lab 07/28/12 1645  AST 15  ALT 16  ALKPHOS 103  BILITOT 0.2*  PROT 8.0  ALBUMIN 4.0   No results found for this basename: LIPASE:5,AMYLASE:5 in the last 168 hours No results found for this basename: AMMONIA:5 in the last 168 hours CBC:  Lab 07/30/12 0635 07/28/12 1645  WBC 6.7 9.7  NEUTROABS -- 5.4  HGB 12.4 12.8  HCT 36.4 38.4  MCV 91.9 92.1  PLT 285 262   Cardiac Enzymes:  Lab 07/28/12 1645  CKTOTAL --  CKMB --  CKMBINDEX --  TROPONINI <0.30   BNP (last 3 results) No results found for this basename: PROBNP:3 in the last 8760 hours CBG:  Lab 07/30/12 0916 07/30/12 0347 07/29/12 2348 07/29/12 1927 07/29/12 1634  GLUCAP 136* 77 84 204*  126*    No results found for this or any previous visit (from the past 240 hour(s)).   Studies: Dg Chest 2 View  07/28/2012  *RADIOLOGY REPORT*  Clinical Data: CVA.  CHEST - 2 VIEW  Comparison:  None.  Findings: The lungs are well-aerated and clear.  There is no evidence of focal opacification, pleural effusion or pneumothorax.  The heart is normal in size; the mediastinal contour is within normal limits.  No acute osseous  abnormalities are seen.   Clips are noted within the right upper quadrant, reflecting prior cholecystectomy.  IMPRESSION: No acute cardiopulmonary process seen.   Original Report Authenticated By: Tonia Ghent, M.D.    Ct Head Wo Contrast  07/28/2012  *RADIOLOGY REPORT*  Clinical Data: 57 year old female Code stroke.  Right side weakness.  Last seen normal 1430 hours.  CT HEAD WITHOUT CONTRAST  Technique:  Contiguous axial images were obtained from the base of the skull through the vertex without contrast.  Comparison: None.  Findings: Visualized paranasal sinuses and mastoids are clear.  No acute osseous abnormality identified.  Visualized orbit soft tissues are within normal limits.  Visualized scalp soft tissues are within normal limits.  The 15 mm focus of hypodensity in the left lateral cerebellum on series 2 image 7.  No associated hemorrhage or mass effect.  Other posterior fossa gray-white matter differentiation within normal limits.  Hypodensity in the left deep gray matter nuclei near the genu of the left internal capsule.  No mass effect or hemorrhage.  Minimal to mild cerebral white matter hypodensity elsewhere.  No ventriculomegaly. No midline shift, mass effect, or evidence of mass lesion.  No acute intracranial hemorrhage identified.  No supratentorial acute cortically based infarct identified. No suspicious intracranial vascular hyperdensity.  IMPRESSION: 1. Small subacute versus chronic left cerebellar and left internal capsule infarcts.  No mass effect or hemorrhage. 2.  No other acute intracranial abnormality identified.  Critical Value/emergent results were called by telephone at the time of interpretation on 07/28/2012 at 1700 hours to Dr. Amada Jupiter, who verbally acknowledged these results.   Original Report Authenticated By: Erskine Speed, M.D.    Mr Brain Wo Contrast  07/28/2012  *RADIOLOGY REPORT*  Clinical Data: 57 year old female with acute onset right side weakness.  Hypertension  diabetes.  MRI HEAD WITHOUT CONTRAST  Technique:  Multiplanar, multiecho pulse sequences of the brain and surrounding structures were obtained according to standard protocol without intravenous contrast.  Comparison: Head CT 1644 hours the same day.  Findings: Restricted diffusion in the left cerebellar hemisphere corresponding in part to the CT findings made earlier.  Associated T2 new and mild FLAIR hyperintensity with no mass effect or hemorrhage.  In addition, there is a tiny focus of restricted diffusion in the undersurface of the left occipital lobe (series 3 image 10) with mild T2 hyperintensity.  At the left deep gray matter nuclei there is restricted diffusion in the lateral left thalamus bordering on or involving the posterior limb of the left internal capsule (series 3 image 14). However, this CT finding near the genu of the left internal capsule and superior left thalamus appears only mildly restricted, and has a greater degree of FLAIR hyperintensity.  No mass effect or hemorrhage associated with these.  No diffusion abnormality identified elsewhere.  The distal left vertebral artery flow void is lost (series 5 image 3 and adjacent images).  Other Major intracranial vascular flow voids are preserved.  However, there is conspicuous increased FLAIR signal and decreased T2*  signal in the left PCA (series 6 image 9 and series 7 image 9).  No ventriculomegaly.  No midline shift. No acute intracranial hemorrhage identified.   Negative pituitary, cervicomedullary junction and visualized cervical spine. Visualized bone marrow signal is within normal limits.  Outside of the acute findings there is mild scattered mostly subcortical cerebral white matter T2 and FLAIR hyperintensity.  Visualized orbit soft tissues are within normal limits.  Visualized paranasal sinuses and mastoids are clear.  Negative scalp soft tissues.  IMPRESSION: 1.  Scattered small acute infarcts in the left cerebellum, inferior left  occipital lobe, and lateral thalamus at or near the posterior limb of the left internal capsule.  No mass effect or hemorrhage associated with these.  The largest individual lesion corresponds to that seen by CT in the cerebellum. 2.  The other CT lesion corresponds to a more subacute appearing lacunar infarct in the superior left thalamus nearer the genu of the internal capsule. 3.  Poor flow or occlusion of the distal left vertebral artery. Suspect poor flow or occlusion also at the left PCA.  Study discussed by telephone with Dr. Amada Jupiter on 1944 hours at 07/28/2012.   Original Report Authenticated By: Erskine Speed, M.D.    Ct Angio Chest Aortic Dissect W &/or W/o  07/29/2012  *RADIOLOGY REPORT*  Clinical Data: Cerebral infarction and reported history of thoracic aortic aneurysm.  CT ANGIOGRAPHY CHEST  Technique:  Multidetector CT imaging of the chest using the standard protocol during bolus administration of intravenous contrast. Multiplanar reconstructed images including MIPs were obtained and reviewed to evaluate the vascular anatomy.  Contrast: 75mL OMNIPAQUE IOHEXOL 350 MG/ML SOLN  Comparison: None.  Findings: The thoracic aorta is not dilated.  The ascending thoracic aorta measures 3.5 cm in greatest diameter.  The aortic arch measures 3.1 cm in diameter.  The descending thoracic aorta measures 2.9 cm.  There is no evidence of significant atherosclerotic disease or aortic dissection.  Visualized proximal great vessels show normal branching anatomy and patency. The proximal right vertebral artery is demonstrated to be normally patent.  A left vertebral artery is not demonstrated and may be hypoplastic or chronically occluded.  The heart size is normal.  No pleural or pericardial effusions. Lung windows show no evidence of pulmonary infiltrates, edema, nodules or significant lung disease.  There are some scattered areas of parenchymal scarring bilaterally.  No enlarged lymph nodes.  The bony thorax is  unremarkable.  IMPRESSION: The thoracic aorta is of normal caliber and demonstrates no evidence of aneurysmal disease.  No acute aortic pathology is identified.  Incidental note made of either hypoplasia or chronic occlusion of the proximal left vertebral artery.   Original Report Authenticated By: Irish Lack, M.D.     Scheduled Meds:    . amLODipine  10 mg Oral Daily  . aspirin  325 mg Oral Daily  . atorvastatin  20 mg Oral q1800  . enoxaparin (LOVENOX) injection  40 mg Subcutaneous Q24H  . insulin aspart  0-9 Units Subcutaneous Q4H  . insulin detemir  40 Units Subcutaneous BID  . potassium chloride  20 mEq Oral BID   Continuous Infusions:   Principal Problem:  *CVA (cerebral vascular accident) Active Problems:  Stroke  Hypertension  Diabetes  Microscopic polyangiitis  Obesity  Hypokalemia    Sandy Davis A, MD  [facility] Pager [phone]. If 7PM-7AM, please contact night-coverage at [email or web address], password [facility] 07/30/2012, 10:55 AM  LOS: 2 days

## 2012-07-30 NOTE — Procedures (Addendum)
Hypoglycemic Event  CBG: 56  Treatment: D50 IV 25 mL  Symptoms: None  Follow-up CBG: Time:0800 CBG Result:106  Possible Reasons for Event: Inadequate meal intake- pt NPO for TEE  Comments/MD notified:    Gerri Lins  Remember to initiate Hypoglycemia Order Set & complete

## 2012-07-30 NOTE — Progress Notes (Signed)
07/30/2012 0940 Pt has referral for IP rehab, sent message to CIR RN for screening. Isidoro Donning RN [facility] Case Mgmt phone [phone]

## 2012-07-30 NOTE — Consult Note (Signed)
Physical Medicine and Rehabilitation Consult Reason for Consult: CVA Referring Physician: [facility]   HPI: Sandy Davis is a 57 y.o. right-handed female with history of hypertension as well as diabetes mellitus with peripheral neuropathy. Admitted 07/28/2012 with slurred speech and right sided numbness. MRI of the brain showed scattered small acute infarct in the left cerebellum, inferior left occipital lobe and lateral thalamus at or near the posterior limb of internal capsule. Carotid Dopplers with no ICA stenosis. Echocardiogram is pending. Neurology services consulted. Patient did not receive TPA. Placed on aspirin therapy as well as subcutaneous Lovenox for DVT prophylaxis. Physical and occupational therapy evaluations completed 07/29/2012 with recommendations of physical medicine rehabilitation consult to consider inpatient rehabilitation services   Review of Systems  Neurological: Positive for tingling, speech change and weakness.  All other systems reviewed and are negative.   Past Medical History  Diagnosis Date  . Hypertension   . Diabetes mellitus without complication   . Aortic aneurysm    History reviewed. No pertinent past surgical history. History reviewed. No pertinent family history. Social History:  reports that she has never smoked. She does not have any smokeless tobacco history on file. She reports that she does not drink alcohol or use illicit drugs. Allergies: No Known Allergies Medications Prior to Admission  Medication Sig Dispense Refill  . amLODipine (NORVASC) 10 MG tablet Take 10 mg by mouth daily.      . insulin detemir (LEVEMIR) 100 UNIT/ML injection Inject 40 Units into the skin 2 (two) times daily.      . metFORMIN (GLUCOPHAGE) 500 MG tablet Take 500 mg by mouth 4 (four) times daily.      . simvastatin (ZOCOR) 40 MG tablet Take 40 mg by mouth every evening.        Home: Home Living Lives With: Family;Daughter Available Help at Discharge:  Family;Available 24 hours/day Type of Home: House Home Access: Stairs to enter [employer] of Steps: 3 Entrance Stairs-Rails: None Home Layout: One level Bathroom Shower/Tub: Health visitor: Standard Home Adaptive Equipment: None  Functional History: Prior Function Able to Take Stairs?: Yes Driving: Yes Functional Status:  Mobility: Bed Mobility Bed Mobility: Supine to Sit;Sitting - Scoot to Edge of Bed;Sit to Supine Supine to Sit: 5: Supervision Sitting - Scoot to Edge of Bed: 5: Supervision Sit to Supine: 5: Supervision Transfers Transfers: Sit to Stand;Stand to Sit Sit to Stand: 3: Mod assist;With upper extremity assist;From bed Stand to Sit: 3: Mod assist;With upper extremity assist;To bed Ambulation/Gait Ambulation/Gait Assistance: 2: Max assist Ambulation Distance (Feet): 10 Feet (x 2) Assistive device: 1 person hand held assist Ambulation/Gait Assistance Details: guard from right side under arm with faciliation to sternum and pelvis for trunk stability.  pt uses hip flexor to slide right leg forward, locking leg in extension.  hops left leg forward and needs physical assistance to prevent traumatic loss of balance. Gait Pattern: Decreased step length - right;Decreased stance time - right;Decreased hip/knee flexion - right;Decreased dorsiflexion - right;Decreased weight shift to right;Right foot flat;Decreased trunk rotation Gait velocity: not tested in small space General Gait Details: see above. Stairs: No Wheelchair Mobility Wheelchair Mobility: No  ADL: ADL Grooming: Performed;Brushing hair;Minimal assistance Where Assessed - Grooming: Unsupported sitting Lower Body Dressing: Performed;Supervision/safety (donned socks) Where Assessed - Lower Body Dressing: Unsupported sitting Toilet Transfer: Simulated;Moderate assistance Toilet Transfer Method: Sit to Barista:  (bed) Equipment Used: Gait  belt Transfers/Ambulation Related to ADLs: min assist for sit<>stand, mod assist to control  descent to bed. ADL Comments: Pt able to don/doff bil socks sitting EOB at supervision level with increased time due to ataxic movement in RUE.    Cognition: Cognition Arousal/Alertness: Awake/alert Orientation Level: Oriented X4 Cognition Overall Cognitive Status: Appears within functional limits for tasks assessed/performed Arousal/Alertness: Awake/alert Orientation Level: Appears intact for tasks assessed Behavior During Session: [facility] for tasks performed  Blood pressure 139/88, pulse 75, temperature 98.6 F (37 C), temperature source Oral, resp. rate 18, SpO2 100.00%. Physical Exam  Vitals reviewed. Constitutional: She is oriented to person, place, and time. She appears well-developed.  HENT:  Head: Normocephalic.  Eyes: EOM are normal.  Neck: Normal range of motion. Neck supple. No thyromegaly present.  Cardiovascular: Normal rate and regular rhythm.   Pulmonary/Chest: Effort normal. No respiratory distress.  Abdominal: Soft. Bowel sounds are normal. She exhibits no distension. There is no tenderness.  Musculoskeletal: She exhibits no edema.  Neurological: She is alert and oriented to person, place, and time.       Follows full commands. Right central 7, minimal tongue deviation. Decreased LT over right face, arm, leg. RUE is 2 to 2+/5 with impaired [facility], almost ataxic. RLE is 2-3/5 with impaired coordination. Good sitting balance. Cognitively intact.  Skin: Skin is warm and dry.  Psychiatric: She has a normal mood and affect. Her behavior is normal. Judgment and thought content normal.    Results for orders placed during the hospital encounter of 07/28/12 (from the past 24 hour(s))  GLUCOSE, CAPILLARY     Status: Abnormal   Collection Time   07/29/12 11:21 AM      Component Value Range   Glucose-Capillary 124 (*) 70 - 99 mg/dL  ANTITHROMBIN III     Status: Normal   Collection Time    07/29/12 11:25 AM      Component Value Range   AntiThromb III Func 96  75 - 120 %  HOMOCYSTEINE     Status: Normal   Collection Time   07/29/12 11:25 AM      Component Value Range   Homocysteine 9.6  4.0 - 15.4 umol/L  GLUCOSE, CAPILLARY     Status: Abnormal   Collection Time   07/29/12  4:34 PM      Component Value Range   Glucose-Capillary 126 (*) 70 - 99 mg/dL  GLUCOSE, CAPILLARY     Status: Abnormal   Collection Time   07/29/12  7:27 PM      Component Value Range   Glucose-Capillary 204 (*) 70 - 99 mg/dL   Comment 1 Documented in Chart     Comment 2 Notify RN    GLUCOSE, CAPILLARY     Status: Normal   Collection Time   07/29/12 11:48 PM      Component Value Range   Glucose-Capillary 84  70 - 99 mg/dL   Comment 1 Notify RN     Comment 2 Documented in Chart    GLUCOSE, CAPILLARY     Status: Normal   Collection Time   07/30/12  3:47 AM      Component Value Range   Glucose-Capillary 77  70 - 99 mg/dL   Comment 1 Documented in Chart     Comment 2 Notify RN    CBC     Status: Normal   Collection Time   07/30/12  6:35 AM      Component Value Range   WBC 6.7  4.0 - 10.5 K/uL   RBC 3.96  3.87 - 5.11 MIL/uL  Hemoglobin 12.4  12.0 - 15.0 g/dL   HCT 16.1  09.6 - 04.5 %   MCV 91.9  78.0 - 100.0 fL   MCH 31.3  26.0 - 34.0 pg   MCHC 34.1  30.0 - 36.0 g/dL   RDW 40.9  81.1 - 91.4 %   Platelets 285  150 - 400 K/uL  BASIC METABOLIC PANEL     Status: Normal   Collection Time   07/30/12  6:35 AM      Component Value Range   Sodium 139  135 - 145 mEq/L   Potassium 3.7  3.5 - 5.1 mEq/L   Chloride 103  96 - 112 mEq/L   CO2 26  19 - 32 mEq/L   Glucose, Bld 72  70 - 99 mg/dL   BUN 12  6 - 23 mg/dL   Creatinine, Ser 7.82  0.50 - 1.10 mg/dL   Calcium 95.6  8.4 - 21.3 mg/dL   GFR calc non Af Amer >90  >90 mL/min   GFR calc Af Amer >90  >90 mL/min  GLUCOSE, CAPILLARY     Status: Abnormal   Collection Time   07/30/12  9:16 AM      Component Value Range   Glucose-Capillary 136 (*) 70 -  99 mg/dL   Dg Chest 2 View  0/86/5784  *RADIOLOGY REPORT*  Clinical Data: CVA.  CHEST - 2 VIEW  Comparison:  None.  Findings: The lungs are well-aerated and clear.  There is no evidence of focal opacification, pleural effusion or pneumothorax.  The heart is normal in size; the mediastinal contour is within normal limits.  No acute osseous abnormalities are seen.   Clips are noted within the right upper quadrant, reflecting prior cholecystectomy.  IMPRESSION: No acute cardiopulmonary process seen.   Original Report Authenticated By: Tonia Ghent, M.D.    Ct Head Wo Contrast  07/28/2012  *RADIOLOGY REPORT*  Clinical Data: 57 year old female Code stroke.  Right side weakness.  Last seen normal 1430 hours.  CT HEAD WITHOUT CONTRAST  Technique:  Contiguous axial images were obtained from the base of the skull through the vertex without contrast.  Comparison: None.  Findings: Visualized paranasal sinuses and mastoids are clear.  No acute osseous abnormality identified.  Visualized orbit soft tissues are within normal limits.  Visualized scalp soft tissues are within normal limits.  The 15 mm focus of hypodensity in the left lateral cerebellum on series 2 image 7.  No associated hemorrhage or mass effect.  Other posterior fossa gray-white matter differentiation within normal limits.  Hypodensity in the left deep gray matter nuclei near the genu of the left internal capsule.  No mass effect or hemorrhage.  Minimal to mild cerebral white matter hypodensity elsewhere.  No ventriculomegaly. No midline shift, mass effect, or evidence of mass lesion.  No acute intracranial hemorrhage identified.  No supratentorial acute cortically based infarct identified. No suspicious intracranial vascular hyperdensity.  IMPRESSION: 1. Small subacute versus chronic left cerebellar and left internal capsule infarcts.  No mass effect or hemorrhage. 2.  No other acute intracranial abnormality identified.  Critical Value/emergent results  were called by telephone at the time of interpretation on 07/28/2012 at 1700 hours to Dr. Amada Jupiter, who verbally acknowledged these results.   Original Report Authenticated By: Erskine Speed, M.D.    Mr Brain Wo Contrast  07/28/2012  *RADIOLOGY REPORT*  Clinical Data: 57 year old female with acute onset right side weakness.  Hypertension diabetes.  MRI HEAD WITHOUT CONTRAST  Technique:  Multiplanar, multiecho pulse sequences of the brain and surrounding structures were obtained according to standard protocol without intravenous contrast.  Comparison: Head CT 1644 hours the same day.  Findings: Restricted diffusion in the left cerebellar hemisphere corresponding in part to the CT findings made earlier.  Associated T2 new and mild FLAIR hyperintensity with no mass effect or hemorrhage.  In addition, there is a tiny focus of restricted diffusion in the undersurface of the left occipital lobe (series 3 image 10) with mild T2 hyperintensity.  At the left deep gray matter nuclei there is restricted diffusion in the lateral left thalamus bordering on or involving the posterior limb of the left internal capsule (series 3 image 14). However, this CT finding near the genu of the left internal capsule and superior left thalamus appears only mildly restricted, and has a greater degree of FLAIR hyperintensity.  No mass effect or hemorrhage associated with these.  No diffusion abnormality identified elsewhere.  The distal left vertebral artery flow void is lost (series 5 image 3 and adjacent images).  Other Major intracranial vascular flow voids are preserved.  However, there is conspicuous increased FLAIR signal and decreased T2* signal in the left PCA (series 6 image 9 and series 7 image 9).  No ventriculomegaly.  No midline shift. No acute intracranial hemorrhage identified.   Negative pituitary, cervicomedullary junction and visualized cervical spine. Visualized bone marrow signal is within normal limits.  Outside of the  acute findings there is mild scattered mostly subcortical cerebral white matter T2 and FLAIR hyperintensity.  Visualized orbit soft tissues are within normal limits.  Visualized paranasal sinuses and mastoids are clear.  Negative scalp soft tissues.  IMPRESSION: 1.  Scattered small acute infarcts in the left cerebellum, inferior left occipital lobe, and lateral thalamus at or near the posterior limb of the left internal capsule.  No mass effect or hemorrhage associated with these.  The largest individual lesion corresponds to that seen by CT in the cerebellum. 2.  The other CT lesion corresponds to a more subacute appearing lacunar infarct in the superior left thalamus nearer the genu of the internal capsule. 3.  Poor flow or occlusion of the distal left vertebral artery. Suspect poor flow or occlusion also at the left PCA.  Study discussed by telephone with Dr. Amada Jupiter on 1944 hours at 07/28/2012.   Original Report Authenticated By: Erskine Speed, M.D.    Ct Angio Chest Aortic Dissect W &/or W/o  07/29/2012  *RADIOLOGY REPORT*  Clinical Data: Cerebral infarction and reported history of thoracic aortic aneurysm.  CT ANGIOGRAPHY CHEST  Technique:  Multidetector CT imaging of the chest using the standard protocol during bolus administration of intravenous contrast. Multiplanar reconstructed images including MIPs were obtained and reviewed to evaluate the vascular anatomy.  Contrast: 75mL OMNIPAQUE IOHEXOL 350 MG/ML SOLN  Comparison: None.  Findings: The thoracic aorta is not dilated.  The ascending thoracic aorta measures 3.5 cm in greatest diameter.  The aortic arch measures 3.1 cm in diameter.  The descending thoracic aorta measures 2.9 cm.  There is no evidence of significant atherosclerotic disease or aortic dissection.  Visualized proximal great vessels show normal branching anatomy and patency. The proximal right vertebral artery is demonstrated to be normally patent.  A left vertebral artery is not  demonstrated and may be hypoplastic or chronically occluded.  The heart size is normal.  No pleural or pericardial effusions. Lung windows show no evidence of pulmonary infiltrates, edema, nodules or significant lung disease.  There  are some scattered areas of parenchymal scarring bilaterally.  No enlarged lymph nodes.  The bony thorax is unremarkable.  IMPRESSION: The thoracic aorta is of normal caliber and demonstrates no evidence of aneurysmal disease.  No acute aortic pathology is identified.  Incidental note made of either hypoplasia or chronic occlusion of the proximal left vertebral artery.   Original Report Authenticated By: Irish Lack, M.D.     Assessment/Plan: Diagnosis: embolic left sided infarcts 1. Does the need for close, 24 hr/day medical supervision in concert with the patient's rehab needs make it unreasonable for this patient to be served in a less intensive setting? Yes 2. Co-Morbidities requiring supervision/potential complications: dm, obesity, htn 3. Due to bladder management, bowel management, safety, skin/wound care, disease management, medication administration, pain management and patient education, does the patient require 24 hr/day rehab nursing? Yes 4. Does the patient require coordinated care of a physician, rehab nurse, PT (1-2 hrs/day, 5 days/week), OT (1-2 hrs/day, 5 days/week) and SLP (1 hrs/day, 5 days/week) to address physical and functional deficits in the context of the above medical diagnosis(es)? Yes Addressing deficits in the following areas: balance, endurance, locomotion, strength, transferring, bowel/bladder control, bathing, dressing, feeding, grooming, toileting and psychosocial support 5. Can the patient actively participate in an intensive therapy program of at least 3 hrs of therapy per day at least 5 days per week? Yes 6. The potential for patient to make measurable gains while on inpatient rehab is excellent 7. Anticipated functional outcomes upon  discharge from inpatient rehab are mod I with PT, mod I to supervision with OT, mod I with SLP. 8. Estimated rehab length of stay to reach the above functional goals is: 2 weeks 9. Does the patient have adequate social supports to accommodate these discharge functional goals? Yes 10. Anticipated D/C setting: Home 11. Anticipated post D/C treatments: HH therapy 12. Overall Rehab/Functional Prognosis: excellent  RECOMMENDATIONS: This patient's condition is appropriate for continued rehabilitative care in the following setting: CIR Patient has agreed to participate in recommended program. Yes Note that insurance prior authorization may be required for reimbursement for recommended care.  Comment:Rehab RN to follow up.   Ivory Broad, MD     07/30/2012

## 2012-07-30 NOTE — Progress Notes (Signed)
VASCULAR LAB PRELIMINARY  PRELIMINARY  PRELIMINARY  PRELIMIN  Transcranial Doppler  Completed.    Preliminary report:  TCD completed.  Makeyla Govan, RVS 07/30/2012, 3:00 PM

## 2012-07-30 NOTE — Progress Notes (Signed)
Pt in testing currently I will see in the morning for an official consult. Appears to be a good candidate for inpatient rehab.  Thanks,   Ranelle Oyster, MD, [place]

## 2012-07-30 NOTE — Progress Notes (Signed)
Rehab Admissions Coordinator Note:  Patient was screened by Trish Mage for appropriateness for an Inpatient Acute Rehab Consult.  At this time, we are recommending Inpatient Rehab consult.  Trish Mage 07/30/2012, 9:41 AM  I can be reached at [phone].

## 2012-07-31 ENCOUNTER — Encounter (HOSPITAL_COMMUNITY)
Admission: EM | Disposition: A | Discharge status: Rehabilitation Facility | Payer: Self-pay | Source: Home / Self Care | Attending: Internal Medicine

## 2012-07-31 ENCOUNTER — Encounter (HOSPITAL_COMMUNITY): Payer: Self-pay | Admitting: Physical Medicine and Rehabilitation

## 2012-07-31 ENCOUNTER — Inpatient Hospital Stay (HOSPITAL_COMMUNITY)
Admission: AD | Admit: 2012-07-31 | Discharge: 2012-08-07 | DRG: 945 | Disposition: A | Discharge status: Home / Self Care | Payer: PRIVATE HEALTH INSURANCE | Source: Intra-hospital | Attending: Physical Medicine & Rehabilitation | Admitting: Physical Medicine & Rehabilitation

## 2012-07-31 DIAGNOSIS — I10 Essential (primary) hypertension: Secondary | ICD-10-CM | POA: Diagnosis not present

## 2012-07-31 DIAGNOSIS — E785 Hyperlipidemia, unspecified: Secondary | ICD-10-CM

## 2012-07-31 DIAGNOSIS — R4789 Other speech disturbances: Secondary | ICD-10-CM | POA: Diagnosis not present

## 2012-07-31 DIAGNOSIS — E041 Nontoxic single thyroid nodule: Secondary | ICD-10-CM | POA: Diagnosis not present

## 2012-07-31 DIAGNOSIS — Z5189 Encounter for other specified aftercare: Principal | ICD-10-CM

## 2012-07-31 DIAGNOSIS — I6340 Cerebral infarction due to embolism of unspecified cerebral artery: Secondary | ICD-10-CM

## 2012-07-31 DIAGNOSIS — E1142 Type 2 diabetes mellitus with diabetic polyneuropathy: Secondary | ICD-10-CM | POA: Diagnosis not present

## 2012-07-31 DIAGNOSIS — K5900 Constipation, unspecified: Secondary | ICD-10-CM | POA: Diagnosis not present

## 2012-07-31 DIAGNOSIS — Z87891 Personal history of nicotine dependence: Secondary | ICD-10-CM | POA: Diagnosis not present

## 2012-07-31 DIAGNOSIS — I6789 Other cerebrovascular disease: Secondary | ICD-10-CM

## 2012-07-31 DIAGNOSIS — E1149 Type 2 diabetes mellitus with other diabetic neurological complication: Secondary | ICD-10-CM

## 2012-07-31 DIAGNOSIS — G4733 Obstructive sleep apnea (adult) (pediatric): Secondary | ICD-10-CM

## 2012-07-31 DIAGNOSIS — Z8249 Family history of ischemic heart disease and other diseases of the circulatory system: Secondary | ICD-10-CM | POA: Diagnosis not present

## 2012-07-31 DIAGNOSIS — R279 Unspecified lack of coordination: Secondary | ICD-10-CM

## 2012-07-31 DIAGNOSIS — M300 Polyarteritis nodosa: Secondary | ICD-10-CM

## 2012-07-31 DIAGNOSIS — I639 Cerebral infarction, unspecified: Secondary | ICD-10-CM | POA: Diagnosis present

## 2012-07-31 DIAGNOSIS — E669 Obesity, unspecified: Secondary | ICD-10-CM | POA: Diagnosis present

## 2012-07-31 DIAGNOSIS — E119 Type 2 diabetes mellitus without complications: Secondary | ICD-10-CM | POA: Diagnosis present

## 2012-07-31 HISTORY — PX: TEE WITHOUT CARDIOVERSION: SHX5443

## 2012-07-31 LAB — GLUCOSE, CAPILLARY
Glucose-Capillary: 106 mg/dL — ABNORMAL HIGH (ref 70–99)
Glucose-Capillary: 131 mg/dL — ABNORMAL HIGH (ref 70–99)
Glucose-Capillary: 165 mg/dL — ABNORMAL HIGH (ref 70–99)

## 2012-07-31 SURGERY — ECHOCARDIOGRAM, TRANSESOPHAGEAL
Anesthesia: Moderate Sedation

## 2012-07-31 MED ORDER — SENNOSIDES-DOCUSATE SODIUM 8.6-50 MG PO TABS: 1.0000 | ORAL_TABLET | Freq: Every evening | ORAL | Status: DC | PRN

## 2012-07-31 MED ORDER — ASPIRIN 325 MG PO TABS: 325.0000 mg | ORAL_TABLET | Freq: Every day | ORAL | Status: DC

## 2012-07-31 MED ORDER — INSULIN DETEMIR 100 UNIT/ML ~~LOC~~ SOLN
35.0000 [IU] | Freq: Two times a day (BID) | SUBCUTANEOUS | Status: DC
  Administered 2012-07-31 – 2012-08-02 (×4): 35 [IU] via SUBCUTANEOUS
  Filled 2012-07-31: qty 10

## 2012-07-31 MED ORDER — BUTAMBEN-TETRACAINE-BENZOCAINE 2-2-14 % EX AERO
INHALATION_SPRAY | CUTANEOUS | Status: DC | PRN
  Administered 2012-07-31: 2 via TOPICAL

## 2012-07-31 MED ORDER — DEXTROSE 50 % IV SOLN
INTRAVENOUS | Status: AC
  Administered 2012-07-31: 12:00:00
  Filled 2012-07-31: qty 50

## 2012-07-31 MED ORDER — ONDANSETRON HCL 4 MG/2ML IJ SOLN: 4.0000 mg | Freq: Four times a day (QID) | INTRAMUSCULAR | Status: DC | PRN

## 2012-07-31 MED ORDER — ACETAMINOPHEN 325 MG PO TABS: 650.0000 mg | ORAL_TABLET | Freq: Four times a day (QID) | ORAL | Status: DC | PRN

## 2012-07-31 MED ORDER — POLYETHYLENE GLYCOL 3350 17 G PO PACK
17.0000 g | PACK | Freq: Every day | ORAL | Status: DC
  Administered 2012-07-31 – 2012-08-03 (×4): 17 g via ORAL
  Filled 2012-07-31 (×5): qty 1

## 2012-07-31 MED ORDER — METFORMIN HCL 500 MG PO TABS
500.0000 mg | ORAL_TABLET | Freq: Two times a day (BID) | ORAL | Status: DC
  Filled 2012-07-31: qty 1

## 2012-07-31 MED ORDER — ASPIRIN 325 MG PO TABS
325.0000 mg | ORAL_TABLET | Freq: Every day | ORAL | Status: DC
  Administered 2012-07-31 – 2012-08-07 (×8): 325 mg via ORAL
  Filled 2012-07-31 (×9): qty 1

## 2012-07-31 MED ORDER — FENTANYL CITRATE 0.05 MG/ML IJ SOLN
INTRAMUSCULAR | Status: AC
  Filled 2012-07-31: qty 2

## 2012-07-31 MED ORDER — INSULIN ASPART 100 UNIT/ML ~~LOC~~ SOLN: 0.0000 [IU] | Freq: Three times a day (TID) | SUBCUTANEOUS | Status: DC

## 2012-07-31 MED ORDER — DEXTROSE 50 % IV SOLN
INTRAVENOUS | Status: AC
  Administered 2012-07-31: 25 mL
  Filled 2012-07-31: qty 50

## 2012-07-31 MED ORDER — ACETAMINOPHEN 325 MG PO TABS
325.0000 mg | ORAL_TABLET | ORAL | Status: DC | PRN
  Administered 2012-07-31: 650 mg via ORAL
  Filled 2012-07-31: qty 2

## 2012-07-31 MED ORDER — MIDAZOLAM HCL 10 MG/2ML IJ SOLN
INTRAMUSCULAR | Status: DC | PRN
  Administered 2012-07-31 (×2): 2 mg via INTRAVENOUS

## 2012-07-31 MED ORDER — ONDANSETRON HCL 4 MG PO TABS: 4.0000 mg | ORAL_TABLET | Freq: Four times a day (QID) | ORAL | Status: DC | PRN

## 2012-07-31 MED ORDER — MIDAZOLAM HCL 5 MG/ML IJ SOLN
INTRAMUSCULAR | Status: AC
  Filled 2012-07-31: qty 2

## 2012-07-31 MED ORDER — INSULIN DETEMIR 100 UNIT/ML ~~LOC~~ SOLN
35.0000 [IU] | Freq: Two times a day (BID) | SUBCUTANEOUS | Status: DC
  Filled 2012-07-31: qty 10

## 2012-07-31 MED ORDER — SODIUM CHLORIDE 0.9 % IV SOLN: INTRAVENOUS | Status: DC

## 2012-07-31 MED ORDER — AMLODIPINE BESYLATE 10 MG PO TABS
10.0000 mg | ORAL_TABLET | Freq: Every day | ORAL | Status: DC
  Administered 2012-07-31 – 2012-08-07 (×8): 10 mg via ORAL
  Filled 2012-07-31 (×10): qty 1

## 2012-07-31 MED ORDER — FENTANYL CITRATE 0.05 MG/ML IJ SOLN
INTRAMUSCULAR | Status: DC | PRN
  Administered 2012-07-31: 50 ug via INTRAVENOUS
  Administered 2012-07-31: 25 ug via INTRAVENOUS

## 2012-07-31 MED ORDER — ATORVASTATIN CALCIUM 20 MG PO TABS
20.0000 mg | ORAL_TABLET | Freq: Every day | ORAL | Status: DC
  Administered 2012-08-01 – 2012-08-06 (×6): 20 mg via ORAL
  Filled 2012-07-31 (×7): qty 1

## 2012-07-31 MED ORDER — ATORVASTATIN CALCIUM 20 MG PO TABS: 20.0000 mg | ORAL_TABLET | Freq: Every day | ORAL | Status: DC

## 2012-07-31 MED ORDER — INSULIN DETEMIR 100 UNIT/ML ~~LOC~~ SOLN: 35.0000 [IU] | Freq: Two times a day (BID) | SUBCUTANEOUS | Status: DC

## 2012-07-31 NOTE — Progress Notes (Signed)
Patient admitted to [id number] via wheelchair, oriented to room, call light, bed controls, CMS data sheet, routine, therapy schedule, team conference, white board, lead nurses, preferred name, personal rehab goal,saferty plan, video, agreement, stroke education booklet, see [facility] for details of assessment. Sandy Davis, Sandy Davis Sandy Davis

## 2012-07-31 NOTE — Progress Notes (Signed)
Stroke Team Progress Note  HISTORY  Sandy Davis is a 57 y.o. female who was bending down earlier on 07/28/12 when she was seen to have whole body shaking. The patient remembers the episode and did not lose conciousness or awareness at any point during the episode. Following this, she had some slurred speech and per her friend was "dragging her right leg). She had double vision at the start of the episode, but this improved. She was not a TPA candidate secondary to mild deficits, ? Seizure at onset. The patient was admitted for further evaluation and treatment.  SUBJECTIVE Patient at bedside. Awaiting TEE.  OBJECTIVE Most recent Vital Signs: Filed Vitals:   07/30/12 2100 07/30/12 2215 07/31/12 0205 07/31/12 0510  BP:  149/74 145/82 156/87  Pulse:  76 55 59  Temp:  98.4 F (36.9 C) 98 F (36.7 C) 98.1 F (36.7 C)  TempSrc:  Oral Oral Oral  Resp:  16 17 17   Height: 5\' 7"  (1.702 m)     Weight: 98.657 kg (217 lb 8 oz)     SpO2:  98% 99% 100%   CBG (last 3)   Basename 07/31/12 0804 07/31/12 0713 07/30/12 2211  GLUCAP 106* 56* 152*   IV Fluid Intake:     . sodium chloride     MEDICATIONS    . amLODipine  10 mg Oral Daily  . aspirin  325 mg Oral Daily  . atorvastatin  20 mg Oral q1800  . enoxaparin (LOVENOX) injection  40 mg Subcutaneous Q24H  . insulin aspart  0-9 Units Subcutaneous TID WC  . insulin detemir  40 Units Subcutaneous BID  . potassium chloride  20 mEq Oral BID   PRN:  acetaminophen, hydrALAZINE, senna-docusate  Diet:  NPO  Activity:  Ambulate with assistance, OOB with assistance and Bedrest  DVT Prophylaxis:  Lovenox  CLINICALLY SIGNIFICANT STUDIES Basic Metabolic Panel:   Lab 07/30/12 0635 07/28/12 1645  NA 139 137  K 3.7 3.4*  CL 103 101  CO2 26 26  GLUCOSE 72 122*  BUN 12 13  CREATININE 0.66 0.79  CALCIUM 10.4 10.9*  MG -- --  PHOS -- --   Liver Function Tests:   Lab 07/28/12 1645  AST 15  ALT 16  ALKPHOS 103  BILITOT 0.2*  PROT 8.0    ALBUMIN 4.0   CBC:   Lab 07/30/12 0635 07/28/12 1645  WBC 6.7 9.7  NEUTROABS -- 5.4  HGB 12.4 12.8  HCT 36.4 38.4  MCV 91.9 92.1  PLT 285 262   Coagulation:   Lab 07/28/12 1645  LABPROT 13.1  INR 1.00   Cardiac Enzymes:   Lab 07/28/12 1645  CKTOTAL --  CKMB --  CKMBINDEX --  TROPONINI <0.30   Urinalysis:   Lab 07/28/12 1747  COLORURINE YELLOW  LABSPEC 1.012  PHURINE 6.5  GLUCOSEU NEGATIVE  HGBUR NEGATIVE  BILIRUBINUR NEGATIVE  KETONESUR NEGATIVE  PROTEINUR NEGATIVE  UROBILINOGEN 0.2  NITRITE NEGATIVE  LEUKOCYTESUR SMALL*   Lipid Panel    Component Value Date/Time   CHOL 146 07/29/2012 0550   TRIG 92 07/29/2012 0550   HDL 47 07/29/2012 0550   CHOLHDL 3.1 07/29/2012 0550   VLDL 18 07/29/2012 0550   LDLCALC 81 07/29/2012 0550   HgbA1C  Lab Results  Component Value Date   HGBA1C 6.3* 07/30/2012   Urine Drug Screen:     Component Value Date/Time   LABOPIA NONE DETECTED 07/28/2012 1747   COCAINSCRNUR NONE DETECTED 07/28/2012 1747  LABBENZ NONE DETECTED 07/28/2012 1747   AMPHETMU NONE DETECTED 07/28/2012 1747   THCU NONE DETECTED 07/28/2012 1747   LABBARB NONE DETECTED 07/28/2012 1747    Alcohol Level: No results found for this basename: ETH:2 in the last 168 hours  Normal - anti III, homocysteine, tsh, free T4, C3, C4, RPR, HIV, Prot S, T3 - 79, lupus, G20120, cardiolipin abx, ANA Prot C - 167   Pending - CH50  CT HEAD WITHOUT CONTRAST  07/28/2012 1. Small subacute versus chronic left cerebellar and left internal capsule infarcts.  No mass effect or hemorrhage. 2.  No other acute intracranial abnormality identified.   MRI Brain 07/28/2012  1.  Scattered small acute infarcts in the left cerebellum, inferior left occipital lobe, and lateral thalamus at or near the posterior limb of the left internal capsule.  No mass effect or hemorrhage associated with these.  The largest individual lesion corresponds to that seen by CT in the cerebellum. 2.  The other CT lesion  corresponds to a more subacute appearing lacunar infarct in the superior left thalamus nearer the genu of the internal capsule. 3.  Poor flow or occlusion of the distal left vertebral artery. Suspect poor flow or occlusion also at the left PCA..   MRA Brain  See TCD  2D Echocardiogram EF 55-60% with no source of embolus.   Carotid Doppler  No evidence of hemodynamically significant internal carotid artery stenosis. Vertebral artery flow is antegrade. Incidental finding: A solid, non-vascularthyroid mass (right lobe) measuring 1.59x 1.44 cm. Kindly correlate with thyroid ultrasound.  TCD Technically limited due to poor windows throughout.Absent bitemporal windows limit evaluation of anterior circulation. Elevated basilar and right vertebral artery mean flow velocities suggest moderate stenosis.    TEE   Thyroid Ultrasound Three separa te thyroid nodules as described. Tissue sampling of  the 1.6 x 1.6 x 1.2 cm lower right thyroid nodule and possibly of  The 1.1 x 0.6 x 0.8 cm lower left thyroid nodule is recommended given moderate internal vascular flow. Upper limits of normal thyroid size.  EKG  sinus rhythm rate 64 beats per minute  Dg Chest 2 View 1/25/2014No acute cardiopulmonary process seen.     CT angio of chest   Therapy Recommendations - CIR  Physical Exam   General - Pleasant 58 year old female in bed in no acute distress. Heart - Regular rate and rhythm - no murmer noted. Lungs - Clear to auscultation Skin - Warm and dry  Mental Status: Alert, oriented, thought content appropriate.  Mild report finding difficulties.  Able to follow 3 step commands without difficulty. Cranial Nerves: ZO:XWRUEA fields grossly normal, pupils equal, round, reactive to light and accommodation III,IV, VI: ptosis not present, extra-ocular motions intact bilaterally with mild exotropia right eye but no dipolpia V,VII: smile symmetric, facial light touch sensation normal bilaterally VIII: hearing  normal bilaterally IX,X: gag reflex present XI: bilateral shoulder shrug XII: midline tongue extension Motor: Right : Upper extremity   4/5 with drift   Left:     Upper extremity   5/5  Lower extremity   3/5     Lower extremity   5/5 Tone and bulk:normal tone throughout; no atrophy noted. Diminished fine finger movements on left and orbits right over left upper extremity Sensory: Decrease sensation to light touch on the right upper and lower extremities. Deep Tendon Reflexes: 2+ and symmetric throughout Plantars: Right: downgoing   Left: downgoing Cerebellar: Finger to nose testing was performed with some difficulty on the right  possibly due to weakness.   ASSESSMENT [name] is a 57 y.o. female presenting with dysarthria and right  Hemiparesis. Imaging confirms scattered small acute infarcts in the left cerebellum, inferior left occipital lobe, and lateral thalamus. Infarcts felt to be embolic. On no anticoagulants prior to admission. Now on aspirin 325 mg daily. Patient with resultant right hemisensory deficit, right hemiparesis, and ataxia. Work up underway.   Cholesterol 146 LDL 81 - on Zocor prior to admission  Diabetes mellitus, HgbA1c 6.3  Hypertension, on amlodipine , prn hydralazine   hx of AA thoracic aneurism which is not followed, CT abd. recommended to reconfirm size Hyperlipidemia, LDL 81, on statin PTA, on lipitor now, at goal LDL < 100 Hx of periarteritis nodosa which could be contributing to her strokes  Right nonvascular thyroid mass, recommend u/s followup  Obesity  Hospital day # 3  TREATMENT/PLAN  Continue aspirin 325 mg daily for secondary stroke prevention. TEE today to look for embolic source. If positive for PFO (patent foramen ovale), check bilateral lower extremity venous dopplers to rule out DVT as possible source of stroke. If TEE unrevealing, Please schedule outpatient telemetry monitoring to assess patient for atrial fibrillation as  source of stroke. May be arranged with patient's cardiologist, or cardiologist of choice.   Ok for transfer to [facility] after TEE Agree with plans for OP thyroid bx  Ongoing aggressive risk factor control by Primary MD F/u CH50 Follow up Dr. Pearlean Brownie in 2 months following rehab  Dr. Pearlean Brownie discussed dx, prognosis and plan of care with patient and Dr. Betti Cruz.  Annie Main, MSN, RN, ANVP-BC, ANP-BC, [facility] Pager: [phone] 07/31/2012 10:13 AM  I have personally obtained a history, examined the patient, evaluated imaging results, and formulated the assessment and plan of care. I agree with the above. Delia Heady, MD Medical Director [facility] Pager: [phone] 07/31/2012 10:13 AM

## 2012-07-31 NOTE — Interval H&P Note (Signed)
History and Physical Interval Note:  07/31/2012 1:07 PM  Sandy Davis  has presented today for surgery, with the diagnosis of cva  The various methods of treatment have been discussed with the patient and family. After consideration of risks, benefits and other options for treatment, the patient has consented to  Procedure(s) (LRB) with comments: TRANSESOPHAGEAL ECHOCARDIOGRAM (TEE) (N/A) as a surgical intervention .  The patient's history has been reviewed, patient examined, no change in status, stable for surgery.  I have reviewed the patient's chart and labs.  Questions were answered to the patient's satisfaction.     Elyn Aquas.

## 2012-07-31 NOTE — Discharge Summary (Signed)
Physician Discharge Summary  Sandy Davis ZOX:[id number] DOB: 14-Nov-1955 DOA: 07/28/2012  PCP: Geraldo Pitter, MD  Admit date: 07/28/2012 Discharge date: 07/31/2012  Time spent: 40 minutes  Recommendations for Outpatient Follow-up:  Followup with Geraldo Pitter, MD (PCP) in 1 week.  Followup with Dr. Pearlean Brownie, neurology in 2 months.  Please discuss further workup for thyroid nodules with your PCP.   Also discuss with your PCP about outpatient telemetry monitoring to assess cardiac arrythmia as source of stroke. May be arranged with patient's cardiologist, or cardiologist of choice.  Discharge Diagnoses:  Principal Problem:  *CVA (cerebral vascular accident) Active Problems:  Stroke  Hypertension  Diabetes  Microscopic polyangiitis  Obesity  Hypokalemia  Discharge Condition: Stable  Diet recommendation: Diabetic diet  Filed Weights   07/30/12 2100  Weight: 98.657 kg (217 lb 8 oz)    History of present illness:  On admission: "Sandy Davis is a 57 y.o. female who felt dizzy and weak and then fell down to the floor, and began to experience blurry vision, slurring of her speech and right sided weakness. She reports also having a frontal headache at that time. Her symptoms began at 2:30 pm and she arrived at the ED at 1615 and a Code Stroke was called. She was assessed By Neurology and was not deemed appropriate for TPA therapy after evaluation and having a NIH score of 2. She was found to have acute infarcts of the left cerebellum and left inferior occipital lobe anda subacute lacunar infarct in the Superior Left Thalmus."  Hospital Course:  Scattered small acute infarcts in the left cerebellum, inferior left occipital lobe, and lateral thalamus at or near the posterior limb of the left internal capsule  2-D echocardiogram done results as indicated below. Transesophageal echocardiogram done with results as indicated below.  Continue aspirin 325 mg daily. Continue carotid Dopplers.  Continue PT/OT/speech therapy. Neurology Dr. Pearlean Brownie evaluated the patient. Hypercoagulability workup sent by neurology: cardiolipin antibody negative, factor V Leiden negative, beta-2 glycoprotein i abs negative, lupus anticoagulant panel negative. Protein C abnormal for protein C activity, total protein C normal. Protein S normal. Homocystine and anti-thrombin III negative. HIV nonreactive. RPR nonreactive, ANA negative, C3 and C4 complement levels normal. LDL 81, simvastatin changed atorvastatin. Hemoglobin A1c 6.3. TEE done with results as indicated below.  Outpatient telemetry monitoring to assess patient for atrial fibrillation as source of stroke. May be arranged with patient's cardiologist, or cardiologist of choice.  Hypertension  Continue home antihypertensive medications.   Diabetes type 2 uncontrolled with complications  Sliding scale insulin. Diabetic diet. Due to hypoglycemia, levemir decreased from 40 to 35 units SQ daily. Metformin held.   Hypokalemia  Replace as needed.   Obesity  Diet and exercise as outpatient.   Hyperlipidemia  LDL 81, continue statin.   Right neck mass  TSH normal, free T4 normal, and T3 low normal. Ultrasound of the neck showed 3 separate thyroid nodules. Will need further evaluation and workup as outpatient, likely will need biopsy.   Prophylaxis  Lovenox.   Consultants:  Neurology  Procedures:  Carotid Dopplers on 07/29/2012  Bilateral: No evidence of hemodynamically significant internal carotid artery stenosis. Vertebral artery flow is antegrade.   2-D echocardiogram 07/29/2012  Study Conclusions - Left ventricle: The cavity size was normal. There was mild concentric hypertrophy. Systolic function was normal. The estimated ejection fraction was in the range of 55% to 60%. Wall motion was normal; there were no regional wall motion abnormalities. Doppler parameters are consistent with abnormal  left ventricular relaxation (grade 1 diastolic  dysfunction). The E/e' ratio is <10, suggesting normal LV filling pressure. - Mitral valve: Calcified annulus. - Left atrium: The atrium was normal in size. - Tricuspid valve: Trivial regurgitation. - Pulmonary arteries: PA peak pressure: 33mm Hg (S). - Inferior vena cava: The vessel was normal in size; the respirophasic diameter changes were in the normal range (= 50%); findings are consistent with normal central venous pressure.  TEE on 07/31/2012 No thrombi, normal left atrial appendage.  Antibiotics:  None.  Discharge Exam: Filed Vitals:   07/31/12 1325 07/31/12 1334 07/31/12 1340 07/31/12 1350  BP: 173/104 152/75 152/75 164/104  Pulse:      Temp:  97.9 F (36.6 C)    TempSrc:  Oral    Resp: 18 18 19 18   Height:      Weight:      SpO2: 96% 99% 97% 95%   Discharge Instructions      Discharge Orders    Future Orders Please Complete By Expires   Diet Carb Modified      Increase activity slowly      Discharge instructions      Comments:   Followup with Geraldo Pitter, MD (PCP) in 1 week.  Followup with Dr. Pearlean Brownie, neurology in 2 months.  Please discuss further workup for thyroid nodules with your PCP. Also discuss with your PCP about outpatient telemetry monitoring to assess cardiac arrythmia as source of stroke. May be arranged with patient's cardiologist, or cardiologist of choice.       Medication List     As of 07/31/2012  2:40 PM    STOP taking these medications         simvastatin 40 MG tablet   Commonly known as: ZOCOR      TAKE these medications         acetaminophen 325 MG tablet   Commonly known as: TYLENOL   Take 2 tablets (650 mg total) by mouth every 6 (six) hours as needed.      amLODipine 10 MG tablet   Commonly known as: NORVASC   Take 10 mg by mouth daily.      aspirin 325 MG tablet   Take 1 tablet (325 mg total) by mouth daily.      atorvastatin 20 MG tablet   Commonly known as: LIPITOR   Take 1 tablet (20 mg total) by mouth daily at  6 PM.      insulin aspart 100 UNIT/ML injection   Commonly known as: novoLOG   Inject 0-9 Units into the skin 3 (three) times daily with meals. Correction coverage: Sensitive (thin, NPO, renal)  CBG < 70: implement hypoglycemia protocol  CBG 70 - 120: 0 units  CBG 121 - 150: 1 unit  CBG 151 - 200: 2 units  CBG 201 - 250: 3 units  CBG 251 - 300: 5 units  CBG 301 - 350: 7 units  CBG 351 - 400: 9 units  CBG > 400: call MD      insulin detemir 100 UNIT/ML injection   Commonly known as: LEVEMIR   Inject 35 Units into the skin 2 (two) times daily.      metFORMIN 500 MG tablet   Commonly known as: GLUCOPHAGE   Take 500 mg by mouth 4 (four) times daily.      senna-docusate 8.6-50 MG per tablet   Commonly known as: Senokot-S   Take 1 tablet by mouth at bedtime as needed for constipation.  Follow-up Information    Follow up with Geraldo Pitter, MD. Schedule an appointment as soon as possible for a visit in 1 week.   Contact information:   [address] [place] [address] [phone]       Follow up with Gates Rigg, MD. In 2 months.   Contact information:   [address], [address] [facility] [place] [address] [phone]           The results of significant diagnostics from this hospitalization (including imaging, microbiology, ancillary and laboratory) are listed below for reference.    Significant Diagnostic Studies: Dg Chest 2 View  07/28/2012  *RADIOLOGY REPORT*  Clinical Data: CVA.  CHEST - 2 VIEW  Comparison:  None.  Findings: The lungs are well-aerated and clear.  There is no evidence of focal opacification, pleural effusion or pneumothorax.  The heart is normal in size; the mediastinal contour is within normal limits.  No acute osseous abnormalities are seen.   Clips are noted within the right upper quadrant, reflecting prior cholecystectomy.  IMPRESSION: No acute cardiopulmonary process seen.   Original  Report Authenticated By: Tonia Ghent, M.D.    Ct Head Wo Contrast  07/28/2012  *RADIOLOGY REPORT*  Clinical Data: 57 year old female Code stroke.  Right side weakness.  Last seen normal 1430 hours.  CT HEAD WITHOUT CONTRAST  Technique:  Contiguous axial images were obtained from the base of the skull through the vertex without contrast.  Comparison: None.  Findings: Visualized paranasal sinuses and mastoids are clear.  No acute osseous abnormality identified.  Visualized orbit soft tissues are within normal limits.  Visualized scalp soft tissues are within normal limits.  The 15 mm focus of hypodensity in the left lateral cerebellum on series 2 image 7.  No associated hemorrhage or mass effect.  Other posterior fossa gray-white matter differentiation within normal limits.  Hypodensity in the left deep gray matter nuclei near the genu of the left internal capsule.  No mass effect or hemorrhage.  Minimal to mild cerebral white matter hypodensity elsewhere.  No ventriculomegaly. No midline shift, mass effect, or evidence of mass lesion.  No acute intracranial hemorrhage identified.  No supratentorial acute cortically based infarct identified. No suspicious intracranial vascular hyperdensity.  IMPRESSION: 1. Small subacute versus chronic left cerebellar and left internal capsule infarcts.  No mass effect or hemorrhage. 2.  No other acute intracranial abnormality identified.  Critical Value/emergent results were called by telephone at the time of interpretation on 07/28/2012 at 1700 hours to Dr. Amada Jupiter, who verbally acknowledged these results.   Original Report Authenticated By: Erskine Speed, M.D.    Mr Brain Wo Contrast  07/28/2012  *RADIOLOGY REPORT*  Clinical Data: 57 year old female with acute onset right side weakness.  Hypertension diabetes.  MRI HEAD WITHOUT CONTRAST  Technique:  Multiplanar, multiecho pulse sequences of the brain and surrounding structures were obtained according to standard protocol  without intravenous contrast.  Comparison: Head CT 1644 hours the same day.  Findings: Restricted diffusion in the left cerebellar hemisphere corresponding in part to the CT findings made earlier.  Associated T2 new and mild FLAIR hyperintensity with no mass effect or hemorrhage.  In addition, there is a tiny focus of restricted diffusion in the undersurface of the left occipital lobe (series 3 image 10) with mild T2 hyperintensity.  At the left deep gray matter nuclei there is restricted diffusion in the lateral left thalamus bordering on or involving the posterior limb of the  left internal capsule (series 3 image 14). However, this CT finding near the genu of the left internal capsule and superior left thalamus appears only mildly restricted, and has a greater degree of FLAIR hyperintensity.  No mass effect or hemorrhage associated with these.  No diffusion abnormality identified elsewhere.  The distal left vertebral artery flow void is lost (series 5 image 3 and adjacent images).  Other Major intracranial vascular flow voids are preserved.  However, there is conspicuous increased FLAIR signal and decreased T2* signal in the left PCA (series 6 image 9 and series 7 image 9).  No ventriculomegaly.  No midline shift. No acute intracranial hemorrhage identified.   Negative pituitary, cervicomedullary junction and visualized cervical spine. Visualized bone marrow signal is within normal limits.  Outside of the acute findings there is mild scattered mostly subcortical cerebral white matter T2 and FLAIR hyperintensity.  Visualized orbit soft tissues are within normal limits.  Visualized paranasal sinuses and mastoids are clear.  Negative scalp soft tissues.  IMPRESSION: 1.  Scattered small acute infarcts in the left cerebellum, inferior left occipital lobe, and lateral thalamus at or near the posterior limb of the left internal capsule.  No mass effect or hemorrhage associated with these.  The largest individual lesion  corresponds to that seen by CT in the cerebellum. 2.  The other CT lesion corresponds to a more subacute appearing lacunar infarct in the superior left thalamus nearer the genu of the internal capsule. 3.  Poor flow or occlusion of the distal left vertebral artery. Suspect poor flow or occlusion also at the left PCA.  Study discussed by telephone with Dr. Amada Jupiter on 1944 hours at 07/28/2012.   Original Report Authenticated By: Erskine Speed, M.D.    US Soft Tissue Head/neck  07/30/2012  *RADIOLOGY REPORT*  Clinical Data: 57 year old female with right thyroid mass identified on recent carotid ultrasound.  THYROID ULTRASOUND  Technique: Ultrasound examination of the thyroid gland and adjacent soft tissues was performed.  Comparison:  Carotid Doppler report performed on 07/29/2012  Findings:  Right thyroid lobe:  The right lobe is slightly inhomogeneous measuring 4.9 x 1.9 x 1.6 cm. Left thyroid lobe:  The left lobe is slightly inhomogeneous measuring 4.6 x 1.6 x 1.5 cm. Isthmus:  The isthmus measures 4 mm in thickness.  Focal nodules:  Focal nodules are as follows: A 1.6 x 1.6 x 1.2 cm nodule with internal vascular flow in the lower right lobe. A 0.9 x 0.6 x 0.8 cm nodule within the mid - upper left thyroid. A 1.1 x 0.6 x 0.8 cm nodule with internal vascular flow in the lower left thyroid lobe  Lymphadenopathy:  None visualized.  IMPRESSION: Three separate thyroid nodules as described.  Tissue sampling of the 1.6 x 1.6 x 1.2 cm lower right thyroid nodule and possibly of the 1.1 x 0.6 x 0.8 cm lower left thyroid nodule is recommended given moderate internal vascular flow.  Upper limits of normal thyroid size.   Original Report Authenticated By: Harmon Pier, M.D.    Ct Angio Chest Aortic Dissect W &/or W/o  07/29/2012  *RADIOLOGY REPORT*  Clinical Data: Cerebral infarction and reported history of thoracic aortic aneurysm.  CT ANGIOGRAPHY CHEST  Technique:  Multidetector CT imaging of the chest using the standard  protocol during bolus administration of intravenous contrast. Multiplanar reconstructed images including MIPs were obtained and reviewed to evaluate the vascular anatomy.  Contrast: 75mL OMNIPAQUE IOHEXOL 350 MG/ML SOLN  Comparison: None.  Findings: The thoracic  aorta is not dilated.  The ascending thoracic aorta measures 3.5 cm in greatest diameter.  The aortic arch measures 3.1 cm in diameter.  The descending thoracic aorta measures 2.9 cm.  There is no evidence of significant atherosclerotic disease or aortic dissection.  Visualized proximal great vessels show normal branching anatomy and patency. The proximal right vertebral artery is demonstrated to be normally patent.  A left vertebral artery is not demonstrated and may be hypoplastic or chronically occluded.  The heart size is normal.  No pleural or pericardial effusions. Lung windows show no evidence of pulmonary infiltrates, edema, nodules or significant lung disease.  There are some scattered areas of parenchymal scarring bilaterally.  No enlarged lymph nodes.  The bony thorax is unremarkable.  IMPRESSION: The thoracic aorta is of normal caliber and demonstrates no evidence of aneurysmal disease.  No acute aortic pathology is identified.  Incidental note made of either hypoplasia or chronic occlusion of the proximal left vertebral artery.   Original Report Authenticated By: Irish Lack, M.D.     Microbiology: No results found for this or any previous visit (from the past 240 hour(s)).   Labs: Basic Metabolic Panel:  Lab 07/30/12 1610 07/28/12 1645  NA 139 137  K 3.7 3.4*  CL 103 101  CO2 26 26  GLUCOSE 72 122*  BUN 12 13  CREATININE 0.66 0.79  CALCIUM 10.4 10.9*  MG -- --  PHOS -- --   Liver Function Tests:  Lab 07/28/12 1645  AST 15  ALT 16  ALKPHOS 103  BILITOT 0.2*  PROT 8.0  ALBUMIN 4.0   No results found for this basename: LIPASE:5,AMYLASE:5 in the last 168 hours No results found for this basename: AMMONIA:5 in the  last 168 hours CBC:  Lab 07/30/12 0635 07/28/12 1645  WBC 6.7 9.7  NEUTROABS -- 5.4  HGB 12.4 12.8  HCT 36.4 38.4  MCV 91.9 92.1  PLT 285 262   Cardiac Enzymes:  Lab 07/28/12 1645  CKTOTAL --  CKMB --  CKMBINDEX --  TROPONINI <0.30   BNP: BNP (last 3 results) No results found for this basename: PROBNP:3 in the last 8760 hours CBG:  Lab 07/31/12 1342 07/31/12 1243 07/31/12 1159 07/31/12 0804 07/31/12 0713  GLUCAP 75 131* 54* 106* 56*     Signed:  Halea Lieb A  [facility] 07/31/2012, 2:40 PM
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
## 2012-07-31 NOTE — H&P (View-Only) (Signed)
[facility] PROGRESS NOTE  Sandy Davis ZOX:[id number] DOB: 1955-07-24 DOA: 07/28/2012 PCP: Geraldo Pitter, MD  Assessment/Plan: Scattered small acute infarcts in the left cerebellum, inferior left occipital lobe, and lateral thalamus at or near the posterior limb of the left internal capsule 2-D echocardiogram done results as indicated below.  Transesophageal echocardiogram for today.  Continue aspirin 325 mg daily.  Continue carotid Dopplers.  Continue PT/OT/speech therapy.  Appreciate neurology evaluation.  Hypercoagulability workup sent by neurology: cardiolipin antibody negative, factor V Leiden negative, beta-2 glycoprotein i abs negative, lupus anticoagulant panel negative. Protein C abnormal for protein C activity, total protein C normal. Protein S normal. Homocystine and anti-thrombin III negative.  HIV nonreactive. RPR nonreactive, ANA negative, C3 and C4 complement levels normal.  LDL 81, simvastatin changed atorvastatin.  Hemoglobin A1c 6.3.  TEE today. CIR versus SNF.  Hypertension Continue home antihypertensive medications.  Diabetes type 2 uncontrolled with complications Sliding scale insulin.  Diabetic diet.  Due to hypoglycemia, levemir decreased from 40 to 35 units SQ daily.  Metformin held.  Hypokalemia Replace as needed.  Obesity Diet and exercise as outpatient.  Hyperlipidemia LDL 81, continue statin.  Right neck mass TSH normal, free T4 normal, and T3 low normal.  Ultrasound of the neck showed 3 separate thyroid nodules.  Will need further evaluation and workup as outpatient, likely will need biopsy.  Prophylaxis Lovenox.  Code Status: Full code. Family Communication: No family at bedside.  Disposition Plan: Pending.  CIR versus SNF today if TEE is negative.  Consultants:  Neurology  Procedures: Carotid Dopplers on 07/29/2012 Bilateral: No evidence of hemodynamically significant internal carotid artery stenosis. Vertebral artery flow is antegrade.    2-D echocardiogram 07/29/2012 Study Conclusions - Left ventricle: The cavity size was normal. There was mild concentric hypertrophy. Systolic function was normal. The estimated ejection fraction was in the range of 55% to 60%. Wall motion was normal; there were no regional wall motion abnormalities. Doppler parameters are consistent with abnormal left ventricular relaxation (grade 1 diastolic dysfunction). The E/e' ratio is <10, suggesting normal LV filling pressure. - Mitral valve: Calcified annulus. - Left atrium: The atrium was normal in size. - Tricuspid valve: Trivial regurgitation. - Pulmonary arteries: PA peak pressure: 33mm Hg (S). - Inferior vena cava: The vessel was normal in size; the respirophasic diameter changes were in the normal range (= 50%); findings are consistent with normal central venous pressure.  Antibiotics:  None.  HPI/Subjective: No specific concerns.  Denies any pain.  Objective: Filed Vitals:   07/30/12 2100 07/30/12 2215 07/31/12 0205 07/31/12 0510  BP:  149/74 145/82 156/87  Pulse:  76 55 59  Temp:  98.4 F (36.9 C) 98 F (36.7 C) 98.1 F (36.7 C)  TempSrc:  Oral Oral Oral  Resp:  16 17 17   Height: 5\' 7"  (1.702 m)     Weight: 98.657 kg (217 lb 8 oz)     SpO2:  98% 99% 100%    Intake/Output Summary (Last 24 hours) at 07/31/12 1135 Last data filed at 07/30/12 1700  Gross per 24 hour  Intake    800 ml  Output      0 ml  Net    800 ml   Filed Weights   07/30/12 2100  Weight: 98.657 kg (217 lb 8 oz)    Exam: Physical Exam: General: Awake, Oriented, No acute distress. HEENT: EOMI. Neck: Supple CV: S1 and S2 Lungs: Clear to ascultation bilaterally Abdomen: Soft, Nontender, Nondistended, +bowel sounds. Ext: Good pulses.  Trace edema.  Data Reviewed: Basic Metabolic Panel:  Lab 07/30/12 4540 07/28/12 1645  NA 139 137  K 3.7 3.4*  CL 103 101  CO2 26 26  GLUCOSE 72 122*  BUN 12 13  CREATININE 0.66 0.79  CALCIUM 10.4 10.9*  MG --  --  PHOS -- --   Liver Function Tests:  Lab 07/28/12 1645  AST 15  ALT 16  ALKPHOS 103  BILITOT 0.2*  PROT 8.0  ALBUMIN 4.0   No results found for this basename: LIPASE:5,AMYLASE:5 in the last 168 hours No results found for this basename: AMMONIA:5 in the last 168 hours CBC:  Lab 07/30/12 0635 07/28/12 1645  WBC 6.7 9.7  NEUTROABS -- 5.4  HGB 12.4 12.8  HCT 36.4 38.4  MCV 91.9 92.1  PLT 285 262   Cardiac Enzymes:  Lab 07/28/12 1645  CKTOTAL --  CKMB --  CKMBINDEX --  TROPONINI <0.30   BNP (last 3 results) No results found for this basename: PROBNP:3 in the last 8760 hours CBG:  Lab 07/31/12 0804 07/31/12 0713 07/30/12 2211 07/30/12 1608 07/30/12 0916  GLUCAP 106* 56* 152* 152* 136*    No results found for this or any previous visit (from the past 240 hour(s)).   Studies: US Soft Tissue Head/neck  07/30/2012  *RADIOLOGY REPORT*  Clinical Data: 57 year old female with right thyroid mass identified on recent carotid ultrasound.  THYROID ULTRASOUND  Technique: Ultrasound examination of the thyroid gland and adjacent soft tissues was performed.  Comparison:  Carotid Doppler report performed on 07/29/2012  Findings:  Right thyroid lobe:  The right lobe is slightly inhomogeneous measuring 4.9 x 1.9 x 1.6 cm. Left thyroid lobe:  The left lobe is slightly inhomogeneous measuring 4.6 x 1.6 x 1.5 cm. Isthmus:  The isthmus measures 4 mm in thickness.  Focal nodules:  Focal nodules are as follows: A 1.6 x 1.6 x 1.2 cm nodule with internal vascular flow in the lower right lobe. A 0.9 x 0.6 x 0.8 cm nodule within the mid - upper left thyroid. A 1.1 x 0.6 x 0.8 cm nodule with internal vascular flow in the lower left thyroid lobe  Lymphadenopathy:  None visualized.  IMPRESSION: Three separate thyroid nodules as described.  Tissue sampling of the 1.6 x 1.6 x 1.2 cm lower right thyroid nodule and possibly of the 1.1 x 0.6 x 0.8 cm lower left thyroid nodule is recommended given moderate  internal vascular flow.  Upper limits of normal thyroid size.   Original Report Authenticated By: Harmon Pier, M.D.    Ct Angio Chest Aortic Dissect W &/or W/o  07/29/2012  *RADIOLOGY REPORT*  Clinical Data: Cerebral infarction and reported history of thoracic aortic aneurysm.  CT ANGIOGRAPHY CHEST  Technique:  Multidetector CT imaging of the chest using the standard protocol during bolus administration of intravenous contrast. Multiplanar reconstructed images including MIPs were obtained and reviewed to evaluate the vascular anatomy.  Contrast: 75mL OMNIPAQUE IOHEXOL 350 MG/ML SOLN  Comparison: None.  Findings: The thoracic aorta is not dilated.  The ascending thoracic aorta measures 3.5 cm in greatest diameter.  The aortic arch measures 3.1 cm in diameter.  The descending thoracic aorta measures 2.9 cm.  There is no evidence of significant atherosclerotic disease or aortic dissection.  Visualized proximal great vessels show normal branching anatomy and patency. The proximal right vertebral artery is demonstrated to be normally patent.  A left vertebral artery is not demonstrated and may be hypoplastic or chronically occluded.  The  heart size is normal.  No pleural or pericardial effusions. Lung windows show no evidence of pulmonary infiltrates, edema, nodules or significant lung disease.  There are some scattered areas of parenchymal scarring bilaterally.  No enlarged lymph nodes.  The bony thorax is unremarkable.  IMPRESSION: The thoracic aorta is of normal caliber and demonstrates no evidence of aneurysmal disease.  No acute aortic pathology is identified.  Incidental note made of either hypoplasia or chronic occlusion of the proximal left vertebral artery.   Original Report Authenticated By: Irish Lack, M.D.     Scheduled Meds:    . amLODipine  10 mg Oral Daily  . aspirin  325 mg Oral Daily  . atorvastatin  20 mg Oral q1800  . enoxaparin (LOVENOX) injection  40 mg Subcutaneous Q24H  . insulin  aspart  0-9 Units Subcutaneous TID WC  . insulin detemir  35 Units Subcutaneous BID  . potassium chloride  20 mEq Oral BID   Continuous Infusions:    . sodium chloride      Principal Problem:  *CVA (cerebral vascular accident) Active Problems:  Stroke  Hypertension  Diabetes  Microscopic polyangiitis  Obesity  Hypokalemia    Domanic Matusek A, MD  [facility] Pager [phone]. If 7PM-7AM, please contact night-coverage at [email or web address], password [facility] 07/31/2012, 11:35 AM  LOS: 3 days

## 2012-07-31 NOTE — Progress Notes (Signed)
Rehab admissions - I have approval from [facility] case manager for acute inpatient rehab admission.  Bed available, patient agreeable and can admit to inpatient rehab today.  Call me for questions.  #[phone]
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
## 2012-07-31 NOTE — Progress Notes (Signed)
[facility] PROGRESS NOTE  Sandy Davis MRN:[id number] DOB: 05/16/1956 DOA: 07/28/2012 PCP: BLAND,VEITA J, MD  Assessment/Plan: Scattered small acute infarcts in the left cerebellum, inferior left occipital lobe, and lateral thalamus at or near the posterior limb of the left internal capsule 2-D echocardiogram done results as indicated below.  Transesophageal echocardiogram for today.  Continue aspirin 325 mg daily.  Continue carotid Dopplers.  Continue PT/OT/speech therapy.  Appreciate neurology evaluation.  Hypercoagulability workup sent by neurology: cardiolipin antibody negative, factor V Leiden negative, beta-2 glycoprotein i abs negative, lupus anticoagulant panel negative. Protein C abnormal for protein C activity, total protein C normal. Protein S normal. Homocystine and anti-thrombin III negative.  HIV nonreactive. RPR nonreactive, ANA negative, C3 and C4 complement levels normal.  LDL 81, simvastatin changed atorvastatin.  Hemoglobin A1c 6.3.  TEE today. CIR versus SNF.  Hypertension Continue home antihypertensive medications.  Diabetes type 2 uncontrolled with complications Sliding scale insulin.  Diabetic diet.  Due to hypoglycemia, levemir decreased from 40 to 35 units SQ daily.  Metformin held.  Hypokalemia Replace as needed.  Obesity Diet and exercise as outpatient.  Hyperlipidemia LDL 81, continue statin.  Right neck mass TSH normal, free T4 normal, and T3 low normal.  Ultrasound of the neck showed 3 separate thyroid nodules.  Will need further evaluation and workup as outpatient, likely will need biopsy.  Prophylaxis Lovenox.  Code Status: Full code. Family Communication: No family at bedside.  Disposition Plan: Pending.  CIR versus SNF today if TEE is negative.  Consultants:  Neurology  Procedures: Carotid Dopplers on 07/29/2012 Bilateral: No evidence of hemodynamically significant internal carotid artery stenosis. Vertebral artery flow is antegrade.    2-D echocardiogram 07/29/2012 Study Conclusions - Left ventricle: The cavity size was normal. There was mild concentric hypertrophy. Systolic function was normal. The estimated ejection fraction was in the range of 55% to 60%. Wall motion was normal; there were no regional wall motion abnormalities. Doppler parameters are consistent with abnormal left ventricular relaxation (grade 1 diastolic dysfunction). The E/e' ratio is <10, suggesting normal LV filling pressure. - Mitral valve: Calcified annulus. - Left atrium: The atrium was normal in size. - Tricuspid valve: Trivial regurgitation. - Pulmonary arteries: PA peak pressure: 33mm Hg (S). - Inferior vena cava: The vessel was normal in size; the respirophasic diameter changes were in the normal range (= 50%); findings are consistent with normal central venous pressure.  Antibiotics:  None.  HPI/Subjective: No specific concerns.  Denies any pain.  Objective: Filed Vitals:   07/30/12 2100 07/30/12 2215 07/31/12 0205 07/31/12 0510  BP:  149/74 145/82 156/87  Pulse:  76 55 59  Temp:  98.4 F (36.9 C) 98 F (36.7 C) 98.1 F (36.7 C)  TempSrc:  Oral Oral Oral  Resp:  16 17 17  Height: 5' 7" (1.702 m)     Weight: 98.657 kg (217 lb 8 oz)     SpO2:  98% 99% 100%    Intake/Output Summary (Last 24 hours) at 07/31/12 1135 Last data filed at 07/30/12 1700  Gross per 24 hour  Intake    800 ml  Output      0 ml  Net    800 ml   Filed Weights   07/30/12 2100  Weight: 98.657 kg (217 lb 8 oz)    Exam: Physical Exam: General: Awake, Oriented, No acute distress. HEENT: EOMI. Neck: Supple CV: S1 and S2 Lungs: Clear to ascultation bilaterally Abdomen: Soft, Nontender, Nondistended, +bowel sounds. Ext: Good pulses.   Trace edema.  Data Reviewed: Basic Metabolic Panel:  Lab 07/30/12 0635 07/28/12 1645  NA 139 137  K 3.7 3.4*  CL 103 101  CO2 26 26  GLUCOSE 72 122*  BUN 12 13  CREATININE 0.66 0.79  CALCIUM 10.4 10.9*  MG --  --  PHOS -- --   Liver Function Tests:  Lab 07/28/12 1645  AST 15  ALT 16  ALKPHOS 103  BILITOT 0.2*  PROT 8.0  ALBUMIN 4.0   No results found for this basename: LIPASE:5,AMYLASE:5 in the last 168 hours No results found for this basename: AMMONIA:5 in the last 168 hours CBC:  Lab 07/30/12 0635 07/28/12 1645  WBC 6.7 9.7  NEUTROABS -- 5.4  HGB 12.4 12.8  HCT 36.4 38.4  MCV 91.9 92.1  PLT 285 262   Cardiac Enzymes:  Lab 07/28/12 1645  CKTOTAL --  CKMB --  CKMBINDEX --  TROPONINI <0.30   BNP (last 3 results) No results found for this basename: PROBNP:3 in the last 8760 hours CBG:  Lab 07/31/12 0804 07/31/12 0713 07/30/12 2211 07/30/12 1608 07/30/12 0916  GLUCAP 106* 56* 152* 152* 136*    No results found for this or any previous visit (from the past 240 hour(s)).   Studies: Us Soft Tissue Head/neck  07/30/2012  *RADIOLOGY REPORT*  Clinical Data: 57-year-old female with right thyroid mass identified on recent carotid ultrasound.  THYROID ULTRASOUND  Technique: Ultrasound examination of the thyroid gland and adjacent soft tissues was performed.  Comparison:  Carotid Doppler report performed on 07/29/2012  Findings:  Right thyroid lobe:  The right lobe is slightly inhomogeneous measuring 4.9 x 1.9 x 1.6 cm. Left thyroid lobe:  The left lobe is slightly inhomogeneous measuring 4.6 x 1.6 x 1.5 cm. Isthmus:  The isthmus measures 4 mm in thickness.  Focal nodules:  Focal nodules are as follows: A 1.6 x 1.6 x 1.2 cm nodule with internal vascular flow in the lower right lobe. A 0.9 x 0.6 x 0.8 cm nodule within the mid - upper left thyroid. A 1.1 x 0.6 x 0.8 cm nodule with internal vascular flow in the lower left thyroid lobe  Lymphadenopathy:  None visualized.  IMPRESSION: Three separate thyroid nodules as described.  Tissue sampling of the 1.6 x 1.6 x 1.2 cm lower right thyroid nodule and possibly of the 1.1 x 0.6 x 0.8 cm lower left thyroid nodule is recommended given moderate  internal vascular flow.  Upper limits of normal thyroid size.   Original Report Authenticated By: Jeffrey Hu, M.D.    Ct Angio Chest Aortic Dissect W &/or W/o  07/29/2012  *RADIOLOGY REPORT*  Clinical Data: Cerebral infarction and reported history of thoracic aortic aneurysm.  CT ANGIOGRAPHY CHEST  Technique:  Multidetector CT imaging of the chest using the standard protocol during bolus administration of intravenous contrast. Multiplanar reconstructed images including MIPs were obtained and reviewed to evaluate the vascular anatomy.  Contrast: 75mL OMNIPAQUE IOHEXOL 350 MG/ML SOLN  Comparison: None.  Findings: The thoracic aorta is not dilated.  The ascending thoracic aorta measures 3.5 cm in greatest diameter.  The aortic arch measures 3.1 cm in diameter.  The descending thoracic aorta measures 2.9 cm.  There is no evidence of significant atherosclerotic disease or aortic dissection.  Visualized proximal great vessels show normal branching anatomy and patency. The proximal right vertebral artery is demonstrated to be normally patent.  A left vertebral artery is not demonstrated and may be hypoplastic or chronically occluded.  The   heart size is normal.  No pleural or pericardial effusions. Lung windows show no evidence of pulmonary infiltrates, edema, nodules or significant lung disease.  There are some scattered areas of parenchymal scarring bilaterally.  No enlarged lymph nodes.  The bony thorax is unremarkable.  IMPRESSION: The thoracic aorta is of normal caliber and demonstrates no evidence of aneurysmal disease.  No acute aortic pathology is identified.  Incidental note made of either hypoplasia or chronic occlusion of the proximal left vertebral artery.   Original Report Authenticated By: Glenn Yamagata, M.D.     Scheduled Meds:    . amLODipine  10 mg Oral Daily  . aspirin  325 mg Oral Daily  . atorvastatin  20 mg Oral q1800  . enoxaparin (LOVENOX) injection  40 mg Subcutaneous Q24H  . insulin  aspart  0-9 Units Subcutaneous TID WC  . insulin detemir  35 Units Subcutaneous BID  . potassium chloride  20 mEq Oral BID   Continuous Infusions:    . sodium chloride      Principal Problem:  *CVA (cerebral vascular accident) Active Problems:  Stroke  Hypertension  Diabetes  Microscopic polyangiitis  Obesity  Hypokalemia    Chauntelle Azpeitia A, MD  [facility] Pager [phone]. If 7PM-7AM, please contact night-coverage at [email or web address], password [email or web address] 07/31/2012, 11:35 AM  LOS: 3 days    

## 2012-07-31 NOTE — Progress Notes (Signed)
Rehab admissions - Evaluated for possible admission.  I spoke with patient and she is interested in inpatient rehab.  I have called and faxed information to [employer] insurance case Production designer, theatre/television/film.  Awaiting insurance carrier decision.  Also awaiting completion of TEE today prior to possible inpatient rehab admission.  Call me for questions.  #[phone]

## 2012-07-31 NOTE — Progress Notes (Signed)
PT Progress Note:      07/31/12 1000  PT Visit Information  Last PT Received On 07/31/12  Assistance Needed +1  PT Time Calculation  PT Start Time 0904  PT Stop Time 0929  PT Time Calculation (min) 25 min  Precautions  Precautions Fall  Cognition  Overall Cognitive Status Appears within functional limits for tasks assessed/performed  Arousal/Alertness Awake/alert  Orientation Level Appears intact for tasks assessed  Behavior During Session [facility] for tasks performed  Bed Mobility  Bed Mobility Not assessed  Details for Bed Mobility Assistance Pt sitting on EOB upon arrival    Transfers  Transfers Sit to Stand;Stand to Sit  Sit to Stand 4: Min guard;With upper extremity assist;From bed  Stand to Sit 4: Min guard;With upper extremity assist;With armrests;To chair/3-in-1  Details for Transfer Assistance Cues for hand placement & technique.    Ambulation/Gait  Ambulation/Gait Assistance 4: Min guard  Ambulation Distance (Feet) 200 Feet  Assistive device Rolling walker  Ambulation/Gait Assistance Details Cues for body positioning inside RW.  Pt improving with ability of increased hip/knee flexion & smoothness of Rt LE swing phase.    Gait Pattern Decreased hip/knee flexion - right;Narrow base of support  Stairs No  Wheelchair Mobility  Wheelchair Mobility No  Modified Rankin (Stroke Patients Only)  Pre-Morbid Rankin Score 0  Modified Rankin 4  Balance  Balance Assessed No  PT - End of Session  Equipment Utilized During Treatment Gait belt  Activity Tolerance Patient tolerated treatment well  Patient left Other (comment);with call bell/phone within reach (in bathroom at sink on sitting on 3-in-1.  )  Nurse Communication Mobility status  PT - Assessment/Plan  Comments on Treatment Session Cont's to report numbness in Rt side is getting worse, but mobility is improving as evident in ability to increase ambulation distance & required decreased assist with tasks.    PT Plan  Discharge plan remains appropriate  PT Frequency Min 4X/week  Follow Up Recommendations CIR  PT equipment Rolling walker with 5" wheels;Cane  Acute Rehab PT Goals  Time For Goal Achievement 08/05/12  Potential to Achieve Goals Good  Pt will go Supine/Side to Sit with min assist  Pt will go Sit to Supine/Side with min assist  Pt will go Sit to Stand with modified independence;with upper extremity assist  PT Goal: Sit to Stand - Progress Progressing toward goal  Pt will go Stand to Sit with modified independence;with upper extremity assist  PT Goal: Stand to Sit - Progress Progressing toward goal  Pt will Ambulate with least restrictive assistive device;51 - 150 feet;with modified independence  PT Goal: Ambulate - Progress Progressing toward goal  PT General Charges  $$ ACUTE PT VISIT 1 Procedure  PT Treatments  $Gait Training 8-22 mins      Verdell Face, [place] [phone] 07/31/2012

## 2012-07-31 NOTE — Progress Notes (Signed)
  Echocardiogram Echocardiogram Transesophageal has been performed.  Sandy Davis A 07/31/2012, 1:43 PM

## 2012-07-31 NOTE — Progress Notes (Signed)
I have reviewed and agree with goal updates at this time.  Charlotte Crumb, PT DPT  [phone]
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
## 2012-07-31 NOTE — Progress Notes (Signed)
Occupational Therapy Treatment Patient Details Name: Sandy Davis MRN: [id number] DOB: 1956-06-20 Today's Date: 07/31/2012 Time: 4098-1191 OT Time Calculation (min): 32 min  OT Assessment / Plan / Recommendation Comments on Treatment Session Pt found up with her walker without assistance on the way back from the bathroom.  Re-emphasized the need for supervision any time she gets out of bed.  Overall only needs min assist for balance and mobility without use of assistive device.  Reports increased tingling on her right side, especially in her hand.  Still with RUE weakness 3+/5 throughout and RLE weakness.  Plan for CIR level therapies later today.    Follow Up Recommendations  CIR          Recommendations for Other Services Rehab consult  Frequency Min 3X/week   Plan Discharge plan remains appropriate    Precautions / Restrictions Precautions Precautions: Fall Restrictions Weight Bearing Restrictions: No   Pertinent Vitals/Pain No pain reported    ADL  Toilet Transfer: Minimal assistance Toilet Transfer Method: Other (comment) (ambulating without assistive device) Toilet Transfer Equipment: Comfort height toilet;Grab bars Toileting - Clothing Manipulation and Hygiene: Performed;Min guard Where Assessed - Toileting Clothing Manipulation and Hygiene: Sit to stand from 3-in-1 or toilet Equipment Used: Gait belt Transfers/Ambulation Related to ADLs: Pt able to ambulate without assistive device with min assist, demonstrates decreased timing and sequence of gait pattern on the left with decreased dorsiflexion at times as well. ADL Comments: Pt performed FM tasks of manipulating objects in her right hand and working her fingers up and down a straw with minimal difficulty.  Does exhibit slight increased tone in the biceps and dysmetria with reaching distally.    OT Goals ADL Goals ADL Goal: Toilet Transfer - Progress: Progressing toward goals ADL Goal: Toileting - Clothing Manipulation  - Progress: Progressing toward goals ADL Goal: Toileting - Hygiene - Progress: Progressing toward goals Arm Goals Arm Goal: Additional Goal #1 - Progress: Progressing toward goals Miscellaneous OT Goals OT Goal: Miscellaneous Goal #2 - Progress: Progressing toward goals  Visit Information  Last OT Received On: 07/31/12 Assistance Needed: +1    Subjective Data  Subjective: I just feel so numb and tingly on the left side.  Will it go away? Patient Stated Goal: Pt wants to get back to being independent.   Prior Functioning  Home Living Lives With: Family;Daughter Type of Home: House Dominant Hand: Right    Cognition  Overall Cognitive Status: Appears within functional limits for tasks assessed/performed Arousal/Alertness: Awake/alert Orientation Level: Appears intact for tasks assessed Behavior During Session: [facility] for tasks performed    Mobility  Shoulder Instructions Transfers Transfers: Sit to Stand Sit to Stand: 4: Min guard;With upper extremity assist;From bed Stand to Sit: 4: Min guard;Without upper extremity assist;To chair/3-in-1          Balance Static Sitting Balance Static Sitting - Level of Assistance: 7: Independent Static Standing Balance Static Standing - Balance Support: No upper extremity supported Static Standing - Level of Assistance: 5: Stand by assistance Dynamic Standing Balance Dynamic Standing - Balance Support: No upper extremity supported Dynamic Standing - Level of Assistance: 4: Min assist   End of Session OT - End of Session Equipment Utilized During Treatment: Gait belt Activity Tolerance: Patient tolerated treatment well Patient left: in chair;with call bell/phone within reach Nurse Communication: Mobility status     Harry Shuck OTR/L Pager number [phone] 07/31/2012, 4:07 PM

## 2012-07-31 NOTE — Progress Notes (Signed)
Hypoglycemic Event  CBG: 54  Treatment: D50 IV 25 mL  Symptoms: None  Follow-up CBG: Time:1230 CBG Result:131  Possible Reasons for Event: Inadequate meal intake- pt NPO for TEE  Comments/MD notified: Patient is now alert and eating lunch post TEE.  Will continue to monitor    Sandy Davis, Sandy Davis  Remember to initiate Hypoglycemia Order Set & complete

## 2012-07-31 NOTE — CV Procedure (Signed)
    Transesophageal Echocardiogram Note  Hermine Feria [id number] 15-Apr-1956  Procedure: Transesophageal Echocardiogram Indications: CVA  Procedure Details Consent: Obtained Time Out: Verified patient identification, verified procedure, site/side was marked, verified correct patient position, special equipment/implants available, Radiology Safety Procedures followed,  medications/allergies/relevent history reviewed, required imaging and test results available.  Performed  Medications: Fentanyl: 75 mcg IV Versed: 4 mg IV  Left Ventrical:  Normal LV function  Mitral Valve: normal  Aortic Valve: normal  Tricuspid Valve: normal  Pulmonic Valve: normal  Left Atrium/ Left atrial appendage: no thrombi, normal LAA  Atrial septum: no PFO or ASD  Aorta: normal   Complications: No apparent complications Patient did tolerate procedure well.   Vesta Mixer, Montez Hageman., MD, [facility] 07/31/2012, 1:26 PM

## 2012-07-31 NOTE — Evaluation (Signed)
Speech Language Pathology Evaluation Patient Details Name: Sandy Davis MRN: [id number] DOB: 1956/04/08 Today's Date: 07/31/2012 Time: 4098-1191 SLP Time Calculation (min): 25 min  Problem List:  Patient Active Problem List  Diagnosis  . Stroke  . CVA (cerebral vascular accident)  . Hypertension  . Diabetes  . Microscopic polyangiitis  . Obesity  . Hypokalemia   Past Medical History:  Past Medical History  Diagnosis Date  . Hypertension   . Diabetes mellitus without complication   . Aortic aneurysm    Past Surgical History: History reviewed. No pertinent past surgical history. HPI:  Sandy Davis is a 57 y.o. female admitted 1/25 after experiencing blurry vision, slurring of her speech and right sided weakness. She was assessed By Neurology and was not deemed appropriate for TPA therapy after evaluation and having a NIH score of 2. She was found to have acute infarcts of the left cerebellum and left inferior occipital lobe and a subacute lacunar infarct in the Superior Left Thalmus.    Assessment / Plan / Recommendation Clinical Impression  Cognitive-linguistic evaluation complete. SLP completed cognistat and based on testing, patient noted with impairements in the areas of sustained attention, reasoning, short term memory, anticipatory awareness, and judgement. It should also be noted however that patient uncooperative with SLP testing, questioning rationale for evaluation, ?unwilling vs unable to answer many questions, verbalizing "i dont know" and refusing to participate actively making diagnosis of cognitive abilities challenging. Extensive education complete with patient regarding rationale for evaluation and potential needs after d/c. Patient did admit to changes in her "thinking" particularly in the areas of concentration which is likely to account for decreased attention and memory however required maximum motivation to participate and be willing to participate in f/u  services. Would recommend SLP f/u at this time to differentiate acute deficits vs baseline behavior and impact on safety with ADLs for return to independent living. Recommend CIR after d/c. SLP will f/u in the acute care setting as well with focus on above.     SLP Assessment  Patient needs continued Speech Lanaguage Pathology Services    Follow Up Recommendations  Inpatient Rehab    Frequency and Duration min 2x/week  2 weeks   Pertinent Vitals/Pain None reported   SLP Goals  SLP Goals Potential to Achieve Goals: Fair Potential Considerations: Cooperation/participation level Progress/Goals/Alternative treatment plan discussed with pt/caregiver and they: Agree (with encouragement) SLP Goal #1: Patient will demonstrate anticipatory awareness during functional ADLs with min verbal questioning cues.  SLP Goal #1 - Progress: Not met SLP Goal #2: Patient will utilize compensatory aids for short term recall of functional daily information with min cues.  SLP Goal #2 - Progress: Not met SLP Goal #3: Patient will sustain attention to clinician presented task with supervision cues.  SLP Goal #3 - Progress: Not met  SLP Evaluation Prior Functioning  Cognitive/Linguistic Baseline: Information not available Type of Home: House Lives With: Family;Daughter   Cognition  Overall Cognitive Status: Impaired Arousal/Alertness: Awake/alert Orientation Level: Oriented X4 Attention: Sustained Sustained Attention: Impaired Sustained Attention Impairment: Verbal complex;Functional complex Memory: Impaired Memory Impairment: Storage deficit;Decreased recall of new information Awareness: Impaired Awareness Impairment: Anticipatory impairment Problem Solving: Impaired Problem Solving Impairment: Verbal complex Executive Function: Reasoning Reasoning: Impaired Reasoning Impairment: Verbal complex Behaviors: Verbal agitation;Poor frustration tolerance Safety/Judgment: Impaired Comments: decreased  safety awareness    Comprehension  Auditory Comprehension Overall Auditory Comprehension: Appears within functional limits for tasks assessed Visual Recognition/Discrimination Discrimination: Not tested Reading Comprehension Reading Status: Not tested  Expression Expression Primary Mode of Expression: Verbal Verbal Expression Overall Verbal Expression: Appears within functional limits for tasks assessed Written Expression Dominant Hand: Right Written Expression: Not tested   Oral / Motor Oral Motor/Sensory Function Overall Oral Motor/Sensory Function: Impaired (c/o right sided numbness) Lingual Strength: Reduced Facial Sensation: Reduced Motor Speech Overall Motor Speech: Appears within functional limits for tasks assessed   GO    Sandy Lango MA, CCC-SLP [phone]  Sandy Davis Sandy Davis 07/31/2012, 3:09 PM

## 2012-07-31 NOTE — Progress Notes (Signed)
Inpatient Diabetes Program Recommendations  AACE/ADA: New Consensus Statement on Inpatient Glycemic Control (2013)  Target Ranges:  Prepandial:   less than 140 mg/dL      Peak postprandial:   less than 180 mg/dL (1-2 hours)      Critically ill patients:  140 - 180 mg/dL   HYPOglycemia this morning.  CBG=56  Inpatient Diabetes Program Recommendations Insulin - Basal: decrease Levemir to 35 BID Thank you  Piedad Climes BSN, RN,CDE Inpatient Diabetes Coordinator [phone] (team pager)

## 2012-08-01 ENCOUNTER — Encounter (HOSPITAL_COMMUNITY): Payer: Self-pay | Admitting: Cardiovascular Disease

## 2012-08-01 ENCOUNTER — Inpatient Hospital Stay (HOSPITAL_COMMUNITY): Payer: PRIVATE HEALTH INSURANCE | Admitting: Speech Pathology

## 2012-08-01 ENCOUNTER — Inpatient Hospital Stay (HOSPITAL_COMMUNITY): Payer: PRIVATE HEALTH INSURANCE | Admitting: Occupational Therapy

## 2012-08-01 ENCOUNTER — Inpatient Hospital Stay (HOSPITAL_COMMUNITY): Payer: PRIVATE HEALTH INSURANCE | Admitting: Physical Therapy

## 2012-08-01 DIAGNOSIS — I10 Essential (primary) hypertension: Secondary | ICD-10-CM

## 2012-08-01 DIAGNOSIS — I6340 Cerebral infarction due to embolism of unspecified cerebral artery: Secondary | ICD-10-CM

## 2012-08-01 DIAGNOSIS — G8110 Spastic hemiplegia affecting unspecified side: Secondary | ICD-10-CM

## 2012-08-01 LAB — CBC WITH DIFFERENTIAL/PLATELET
Basophils Absolute: 0 10*3/uL (ref 0.0–0.1)
Eosinophils Absolute: 0.4 10*3/uL (ref 0.0–0.7)
Eosinophils Relative: 6 % — ABNORMAL HIGH (ref 0–5)
Lymphocytes Relative: 32 % (ref 12–46)
MCH: 30.4 pg (ref 26.0–34.0)
MCV: 93.7 fL (ref 78.0–100.0)
Neutrophils Relative %: 55 % (ref 43–77)
Platelets: 283 10*3/uL (ref 150–400)
RDW: 12.9 % (ref 11.5–15.5)
WBC: 7.3 10*3/uL (ref 4.0–10.5)

## 2012-08-01 LAB — COMPREHENSIVE METABOLIC PANEL
Albumin: 3.4 g/dL — ABNORMAL LOW (ref 3.5–5.2)
Alkaline Phosphatase: 97 U/L (ref 39–117)
BUN: 12 mg/dL (ref 6–23)
CO2: 29 mEq/L (ref 19–32)
Chloride: 107 mEq/L (ref 96–112)
GFR calc non Af Amer: >90 mL/min (ref >90)
Potassium: 4.1 mEq/L (ref 3.5–5.1)
Total Bilirubin: 0.2 mg/dL — ABNORMAL LOW (ref 0.3–1.2)

## 2012-08-01 LAB — COMPLEMENT, TOTAL: Compl, Total (CH50): >60 U/mL — ABNORMAL HIGH (ref 31–60)

## 2012-08-01 MED ORDER — ENOXAPARIN SODIUM 40 MG/0.4ML ~~LOC~~ SOLN
40.0000 mg | Freq: Every day | SUBCUTANEOUS | Status: DC
  Administered 2012-08-01 – 2012-08-07 (×7): 40 mg via SUBCUTANEOUS
  Filled 2012-08-01 (×8): qty 0.4

## 2012-08-01 MED ORDER — INSULIN ASPART 100 UNIT/ML ~~LOC~~ SOLN
0.0000 [IU] | Freq: Three times a day (TID) | SUBCUTANEOUS | Status: DC
  Administered 2012-08-01 – 2012-08-03 (×2): 1 [IU] via SUBCUTANEOUS

## 2012-08-01 MED ORDER — INSULIN ASPART 100 UNIT/ML ~~LOC~~ SOLN: 0.0000 [IU] | Freq: Every day | SUBCUTANEOUS | Status: DC

## 2012-08-01 MED ORDER — TRAMADOL HCL 50 MG PO TABS
50.0000 mg | ORAL_TABLET | Freq: Four times a day (QID) | ORAL | Status: DC | PRN
  Administered 2012-08-01 – 2012-08-07 (×18): 50 mg via ORAL
  Filled 2012-08-01 (×19): qty 1

## 2012-08-01 MED ORDER — GABAPENTIN 100 MG PO CAPS
100.0000 mg | ORAL_CAPSULE | Freq: Three times a day (TID) | ORAL | Status: DC
  Administered 2012-08-01 – 2012-08-03 (×7): 100 mg via ORAL
  Filled 2012-08-01 (×10): qty 1

## 2012-08-01 NOTE — Progress Notes (Signed)
Subjective/Complaints: Busy day. Complains of increased tingling and pain on right side.  A 12 point review of systems has been performed and if not noted above is otherwise negative.   Objective: Vital Signs: Blood pressure 127/83, pulse 68, temperature 97.8 F (36.6 C), temperature source Oral, resp. rate 18, height 5\' 7"  (1.702 m), weight 98.3 kg (216 lb 11.4 oz), SpO2 96.00%. US Soft Tissue Head/neck  07/30/2012  *RADIOLOGY REPORT*  Clinical Data: 57 year old female with right thyroid mass identified on recent carotid ultrasound.  THYROID ULTRASOUND  Technique: Ultrasound examination of the thyroid gland and adjacent soft tissues was performed.  Comparison:  Carotid Doppler report performed on 07/29/2012  Findings:  Right thyroid lobe:  The right lobe is slightly inhomogeneous measuring 4.9 x 1.9 x 1.6 cm. Left thyroid lobe:  The left lobe is slightly inhomogeneous measuring 4.6 x 1.6 x 1.5 cm. Isthmus:  The isthmus measures 4 mm in thickness.  Focal nodules:  Focal nodules are as follows: A 1.6 x 1.6 x 1.2 cm nodule with internal vascular flow in the lower right lobe. A 0.9 x 0.6 x 0.8 cm nodule within the mid - upper left thyroid. A 1.1 x 0.6 x 0.8 cm nodule with internal vascular flow in the lower left thyroid lobe  Lymphadenopathy:  None visualized.  IMPRESSION: Three separate thyroid nodules as described.  Tissue sampling of the 1.6 x 1.6 x 1.2 cm lower right thyroid nodule and possibly of the 1.1 x 0.6 x 0.8 cm lower left thyroid nodule is recommended given moderate internal vascular flow.  Upper limits of normal thyroid size.   Original Report Authenticated By: Harmon Pier, M.D.     Basename 08/01/12 0419 07/30/12 0635  WBC 7.3 6.7  HGB 11.5* 12.4  HCT 35.4* 36.4  PLT 283 285    Basename 08/01/12 0419 07/30/12 0635  NA 145 139  K 4.1 3.7  CL 107 103  GLUCOSE 87 72  BUN 12 12  CREATININE 0.72 0.66  CALCIUM 10.3 10.4   CBG (last 3)   Basename 08/01/12 0702 07/31/12 2102  07/31/12 1653  GLUCAP 79 165* 173*    Wt Readings from Last 3 Encounters:  07/31/12 98.3 kg (216 lb 11.4 oz)  07/30/12 98.657 kg (217 lb 8 oz)  07/30/12 98.657 kg (217 lb 8 oz)    Physical Exam:  Constitutional: She is oriented to person, place, and time. She appears well-developed and well-nourished.  HENT:  Head: Normocephalic and atraumatic.  Eyes: Pupils are equal, round, and reactive to light.  Neck: Normal range of motion.  Cardiovascular: Normal rate and regular rhythm. No murmur  Pulmonary/Chest: Effort normal and breath sounds normal.  Abdominal: Soft. Bowel sounds are normal.  Musculoskeletal: She exhibits no edema and no tenderness.  Neurological: She is alert and oriented to person, place, and time.  Follows full commands. Right central 7, minimal tongue deviation. Decreased LT over right face, arm, leg. RUE is 3 to 3+ at shouder, biceps, triceps, HI are 3-/5 with impaired FMC, ataxic. Right pronator drift. LE is 3/5 with impaired coordination. Good sitting balance. Speech is intelligible. Cognitively intact.  Skin: Skin is warm and dry.  Dry annular lesions on bilateral hands.  Psychiatric: She has a normal mood and affect. Her speech is normal and behavior is normal. Thought content normal. Cognition and memory are normal.    Assessment/Plan: 1. Functional deficits secondary to embolic infarct involving thethe left cerebellum, inferior left occipital lobe and lateral thalamus  which require  3+ hours per day of interdisciplinary therapy in a comprehensive inpatient rehab setting. Physiatrist is providing close team supervision and 24 hour management of active medical problems listed below. Physiatrist and rehab team continue to assess barriers to discharge/monitor patient progress toward functional and medical goals. FIM:       FIM - Toileting Toileting steps completed by patient: Adjust clothing prior to toileting;Performs perineal hygiene;Adjust clothing after  toileting Toileting Assistive Devices: Grab bar or rail for support Toileting: 5: Supervision: Safety issues/verbal cues           Comprehension Comprehension Mode: Auditory Comprehension: 6-Follows complex conversation/direction: With extra time/assistive device  Expression Expression Mode: Verbal Expression: 5-Expresses complex 90% of the time/cues < 10% of the time  Social Interaction Social Interaction: 7-Interacts appropriately with others - No medications needed.  Problem Solving Problem Solving: 5-Solves complex 90% of the time/cues < 10% of the time  Memory Memory: 5-Recognizes or recalls 90% of the time/requires cueing < 10% of the time  Medical Problem List and Plan:  1. DVT Prophylaxis/Anticoagulation: Pharmaceutical: Lovenox  2. Pain Management: add ultram and neurontin for neuropathic pain 3. Mood: motivated to get better and home. Continue to provide ego support about progress. LCSW to follow for formal evaluation.  4. Neuropsych: This patient is capable of making decisions on his/her own behalf.  5. HTN: monitor with bid checks. Continue Norvasc. Monitor for need of additional agent.  6. DM type 2: monitor with ac/hs. CBG checks. Continue levemir bid with SSI for elevated BS. Metformin on hold due to dye study.  7. Dyslipidemia: continue Lipitor daily.  8. Thyroid nodules: follow up on outpatient basis.  9. Microscopic polyangiitis: No meds X 3 years.   LOS (Days) 1 A FACE TO FACE EVALUATION WAS PERFORMED  Freada Twersky T 08/01/2012 8:20 AM
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
## 2012-08-01 NOTE — Care Management Note (Signed)
Inpatient Rehabilitation Center Individual Statement of Services  Patient Name:  Sandy Davis  Date:  08/01/2012  Welcome to the Inpatient Rehabilitation Center.  Our goal is to provide you with an individualized program based on your diagnosis and situation, designed to meet your specific needs.  With this comprehensive rehabilitation program, you will be expected to participate in at least 3 hours of rehabilitation therapies Monday-Friday, with modified therapy programming on the weekends.  Your rehabilitation program will include the following services:  Physical Therapy (PT), Occupational Therapy (OT), Speech Therapy (ST), 24 hour per day rehabilitation nursing, Therapeutic Recreaction (TR), Case Management (RN and Child psychotherapist), Rehabilitation Medicine, Nutrition Services and Pharmacy Services  Weekly team conferences will be held on Wednesday to discuss your progress.  Your RN Case Designer, television/film set will talk with you frequently to get your input and to update you on team discussions.  Team conferences with you and your family in attendance may also be held.  Expected length of stay: 7 days Overall anticipated outcome: mod/i level  Depending on your progress and recovery, your program may change.  Your RN Case Estate agent will coordinate services and will keep you informed of any changes.  Your RN Sports coach and SW names and contact numbers are listed  below.  The following services may also be recommended but are not provided by the Inpatient Rehabilitation Center:   Driving Evaluations  Home Health Rehabiltiation Services  Outpatient Rehabilitatation [facility]  Vocational Rehabilitation   Arrangements will be made to provide these services after discharge if needed.  Arrangements include referral to agencies that provide these services.  Your insurance has been verified to be:  [employer] & Medicaid Your primary doctor is:  Dr Parke Simmers  Pertinent information  will be shared with your doctor and your insurance company.   Social Worker:  Dossie Der, [place] [phone]  Information discussed with and copy given to patient by: Lucy Chris, 08/01/2012, 1:23 PM

## 2012-08-01 NOTE — Progress Notes (Signed)
Social Work Patient ID: Sandy Davis, female   DOB: 1956-01-23, 57 y.o.   MRN: [id number] Met with pt and informed her of team conference goals-mod/i level and discharge 2/4.  She is pleased with the date and feel she will Be ready by this time.  She is encouraged by her progress and hopeful it will continue.  Discuss with daughter regarding questions or concerns.

## 2012-08-01 NOTE — Progress Notes (Signed)
Occupational Therapy Session Note  Patient Details  Name: Sandy Davis MRN: [id number] Date of Birth: Nov 09, 1955  Today's Date: 08/01/2012 Time: 1914-7829 Time Calculation (min): 30 min  Short Term Goals: Week 1:  OT Short Term Goal 1 (Week 1): LTG = STGs due to short ELOS  Skilled Therapeutic Interventions/Progress Updates:  Patient sleeping in recliner upon arrival.  Engaged in ambulation therapy apartment to participate in IADL tasks with HHA of right hand and facilitation to speed up her walking pace.  Forced use of RUE/hand with functional tasks of open close window blinds, open door with key, turn on table lamp with and without using her sight, challenge balance while reaching to floor and outside of her BOS to retrieve/replace items, unplug and plug in lamp at wall socket.  Ambulated HHA with LUE to therapy gym to engage in standing tolerance while performing tabletop activing to improve coordination.  Ambulated back to room with close supervision.  Patient seated in recliner with phone and call bell within reach.  Therapy Documentation Precautions:  Precautions Precautions: Fall Precaution Comments: impaired sensation R side and h/o peripheral neuropathy and DM: needs to wear shoes OOB when walking Restrictions Weight Bearing Restrictions: No Pain: Denies pain just tingling on right side  Therapy/Group: Individual Therapy  Eupha Lobb 08/01/2012, 4:11 PM
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
## 2012-08-01 NOTE — Patient Care Conference (Signed)
Inpatient RehabilitationTeam Conference and Plan of Care Update Date: 08/01/2012   Time: 11:30 Am    Patient Name: Sandy Davis      Medical Record Number: [id number]  Date of Birth: 1956-02-08 Sex: Female         Room/Bed: 4142/4142-01 Payor Info: Payor: Advertising copywriter MEDICARE  Plan: [facility]  Product Type: *No Product type*     Admitting Diagnosis: cva, thoracic aorta dissection  Admit Date/Time:  07/31/2012  6:10 PM Admission Comments: No comment available   Primary Diagnosis:  CVA (cerebral vascular accident) Principal Problem: CVA (cerebral vascular accident)  Patient Active Problem List   Diagnosis Date Noted  . Stroke 07/28/2012  . CVA (cerebral vascular accident) 07/28/2012  . Hypertension 07/28/2012  . Diabetes 07/28/2012  . Microscopic polyangiitis 07/28/2012  . Obesity 07/28/2012  . Hypokalemia 07/28/2012    Expected Discharge Date: Expected Discharge Date: 08/07/12  Team Members Present: Physician leading conference: Dr. Claudette Laws Social Worker Present: Dossie Der, LCSW Nurse Present: Gregor Hams, RN PT Present: Edman Circle, PT;Caroline Adriana Simas, PT;Other (comment) Clarisse Gouge Ripa-PT) OT Present: Leonette Monarch, OT SLP Present: Fae Pippin, SLP Other (Discipline and Name): laura Jobe-Dietician     Current Status/Progress Goal Weekly Team Focus  Medical     pain management   controlled so doesn;t interfere with therapies     Bowel/Bladder     cont B & B        Swallow/Nutrition/ Hydration     na        ADL's     min assist with B & D   Mod I    Balance, pain management and ADL's  Mobility   min-mod A overall  mod I   dynamic balance, gait, pain management R side   Communication   eval pending         Safety/Cognition/ Behavioral Observations  eval pending         Pain     Working on pain with meds, tens unit, repositioning   lessen pain     Skin     na           *See Interdisciplinary Assessment and Plan and progress notes for  long and short-term goals  Barriers to Discharge:   Pain issues   Possible Resolutions to Barriers:    Medication adjustments, moving well   Discharge Planning/Teaching Needs:    Home with daughter whom she lives with.  High level and will be short length of stay.  Eval today     Team Discussion:  At min/sup level will be short length of stay.  Tingling sensation on CVA affected side, treating now with meds.  Revisions to Treatment Plan:  New eval      Lucy Chris 08/02/2012, 2:19 PM
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
## 2012-08-01 NOTE — Progress Notes (Signed)
Patient information reviewed and entered into eRehab system by Jaeley Wiker, RN, CRRN, PPS Coordinator.  Information including medical coding and functional independence measure will be reviewed and updated through discharge.     Per nursing patient was given "Data Collection Information Summary for Patients in Inpatient Rehabilitation Facilities with attached "Privacy Act Statement-Health Care Records" upon admission.  

## 2012-08-02 ENCOUNTER — Inpatient Hospital Stay (HOSPITAL_COMMUNITY): Payer: PRIVATE HEALTH INSURANCE | Admitting: Occupational Therapy

## 2012-08-02 ENCOUNTER — Inpatient Hospital Stay (HOSPITAL_COMMUNITY): Payer: PRIVATE HEALTH INSURANCE | Admitting: Speech Pathology

## 2012-08-02 ENCOUNTER — Inpatient Hospital Stay (HOSPITAL_COMMUNITY): Payer: PRIVATE HEALTH INSURANCE | Admitting: Physical Therapy

## 2012-08-02 ENCOUNTER — Inpatient Hospital Stay (HOSPITAL_COMMUNITY): Payer: PRIVATE HEALTH INSURANCE | Admitting: *Deleted

## 2012-08-02 MED ORDER — INSULIN DETEMIR 100 UNIT/ML ~~LOC~~ SOLN
25.0000 [IU] | Freq: Every day | SUBCUTANEOUS | Status: DC
  Administered 2012-08-02: 25 [IU] via SUBCUTANEOUS

## 2012-08-02 MED ORDER — INSULIN DETEMIR 100 UNIT/ML ~~LOC~~ SOLN
35.0000 [IU] | Freq: Every day | SUBCUTANEOUS | Status: DC
  Administered 2012-08-03 – 2012-08-04 (×2): 35 [IU] via SUBCUTANEOUS

## 2012-08-02 NOTE — Progress Notes (Signed)
Speech Language Pathology Daily Session Note  Patient Details  Name: Sandy Davis MRN: [id number] Date of Birth: 1956/06/18  Today's Date: 08/02/2012 Time: 6962-9528 Time Calculation (min): 42 min  Short Term Goals: Week 1: SLP Short Term Goal 1 (Week 1): Patient will participate in discharge planning by predicting needs after discharge with supervision level verbal cues. SLP Short Term Goal 2 (Week 1): Patient will utilize word finind strategies during converastion with supervision level verbal cues. SLP Short Term Goal 3 (Week 1): Patient will solve complex problems with supervision level verbal cues.  Skilled Therapeutic Interventions: Skilled treatment session focused on addressing cognitive goals while preparing for a cooking task tomorrow.  Patient wrote recipe during yesterday's session and SLP requested patient locate ingredients and items needed for prepare recipe during tomorrow's OT session.  SLP provided min assist while patient walked to kitchen and facilitated session with min assist verbal cues to utilize external aids for recall of items and ingredients needed as well as to utilize using a check system after to assist with working memory and recall.     FIM:  Comprehension Comprehension Mode: Auditory Comprehension: 5-Follows basic conversation/direction: With no assist Expression Expression Mode: Verbal Expression: 5-Expresses basic 90% of the time/requires cueing < 10% of the time. Social Interaction Social Interaction: 6-Interacts appropriately with others with medication or extra time (anti-anxiety, antidepressant). Problem Solving Problem Solving: 5-Solves basic 90% of the time/requires cueing < 10% of the time Memory Memory: 4-Recognizes or recalls 75 - 89% of the time/requires cueing 10 - 24% of the time FIM - Eating Eating Activity: 7: Complete independence:no helper  Pain Pain Assessment Pain Assessment: 0-10 Pain Score:   7 Pain Type: Neuropathic  pain Pain Location: Arm Pain Orientation: Right Pain Descriptors: Numbness;Tingling Pain Frequency: Constant Pain Onset: On-going Patients Stated Pain Goal: 3 Pain Intervention(s): Patient notified RN  Therapy/Group: Individual Therapy  Sandy Davis., CCC-SLP [phone]  Sandy Davis 08/02/2012, 3:36 PM

## 2012-08-02 NOTE — Progress Notes (Signed)
Physical Therapy Note  Patient Details  Name: Sandy Davis MRN: [id number] Date of Birth: October 22, 1955 Today's Date: 08/02/2012  1000-1055 (55 minutes) individual Pain: no complaint of pain Focus of treatment: Therapeutic activities focused on RT LE neuro re-ed ; gait training/endurance Treatment: Gait without AD- min assist 150 feet X 2 with vcs for heel-toe gait on right and decreased forward trunk lean; Kinetron in standing with/without UE assist for RT knee control in stance/ standing balance (min assist); up/down 6 inch step with bilateral UE support for quad strengthening; alternate tapping to 6 inch step (balance) min assist.    Khian Remo,JIM 08/02/2012, 7:35 AM

## 2012-08-02 NOTE — Progress Notes (Signed)
Patient ID: Sandy Davis, female   DOB: [birth year], 57 y.o.   MRN: [id number] Subjective/Complaints: Continues to complain of tingling and numbness, we discussed Thalamic infarct as the etiology of these complaints. A 12 point review of systems has been performed and if not noted above is otherwise negative.   Objective: Vital Signs: Blood pressure 121/85, pulse 62, temperature 97.9 F (36.6 C), temperature source Oral, resp. rate 18, height 5\' 7"  (1.702 m), weight 98.3 kg (216 lb 11.4 oz), SpO2 96.00%. No results found.  Basename 08/01/12 0419  WBC 7.3  HGB 11.5*  HCT 35.4*  PLT 283    Basename 08/01/12 0419  NA 145  K 4.1  CL 107  GLUCOSE 87  BUN 12  CREATININE 0.72  CALCIUM 10.3   CBG (last 3)   Basename 08/01/12 0702 07/31/12 2102 07/31/12 1653  GLUCAP 79 165* 173*    Wt Readings from Last 3 Encounters:  07/31/12 98.3 kg (216 lb 11.4 oz)  07/30/12 98.657 kg (217 lb 8 oz)  07/30/12 98.657 kg (217 lb 8 oz)    Physical Exam:  Constitutional: She is oriented to person, place, and time. She appears well-developed and well-nourished.  HENT:  Head: Normocephalic and atraumatic.  Eyes: Pupils are equal, round, and reactive to light.  Neck: Normal range of motion.  Cardiovascular: Normal rate and regular rhythm. No murmur  Pulmonary/Chest: Effort normal and breath sounds normal.  Abdominal: Soft. Bowel sounds are normal.  Musculoskeletal: She exhibits no edema and no tenderness.  Neurological: She is alert and oriented to person, place, and time.  Follows full commands. Right central 7, minimal tongue deviation. Decreased LT over right face, arm, leg. RUE is 3 to 3+ at shouder, biceps, triceps, HI are 3-/5 with impaired FMC, ataxic. Right pronator drift. LE is 3/5 with impaired coordination. Good sitting balance. Speech is intelligible. Cognitively intact.  Sensation reduced on Right side face, arm and leg. Skin: Skin is warm and dry.  Dry annular lesions on bilateral  hands.  Psychiatric: She has a normal mood and affect. Her speech is normal and behavior is normal. Thought content normal. Cognition and memory are normal.    Assessment/Plan: 1. 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 Physiatrist is providing close team supervision and 24 hour management of active medical problems listed below. Physiatrist and rehab team continue to assess barriers to discharge/monitor patient progress toward functional and medical goals. FIM: FIM - Bathing Bathing Steps Patient Completed: Chest;Right Arm;Left Arm;Abdomen;Front perineal area;Buttocks;Right lower leg (including foot);Left lower leg (including foot);Left upper leg;Right upper leg Bathing: 4: Steadying assist  FIM - Upper Body Dressing/Undressing Upper body dressing/undressing steps patient completed: Thread/unthread right bra strap;Thread/unthread left bra strap;Hook/unhook bra;Thread/unthread right sleeve of pullover shirt/dresss;Thread/unthread left sleeve of pullover shirt/dress;Put head through opening of pull over shirt/dress;Pull shirt over trunk Upper body dressing/undressing: 6: More than reasonable amount of time FIM - Lower Body Dressing/Undressing Lower body dressing/undressing steps patient completed: Thread/unthread right underwear leg;Thread/unthread left underwear leg;Pull underwear up/down;Thread/unthread right pants leg;Thread/unthread left pants leg;Pull pants up/down;Fasten/unfasten pants;Don/Doff right sock;Don/Doff left sock;Don/Doff right shoe;Don/Doff left shoe Lower body dressing/undressing: 5: Supervision: Safety issues/verbal cues  FIM - Toileting Toileting steps completed by patient: Adjust clothing prior to toileting;Performs perineal hygiene;Adjust clothing after toileting Toileting  Assistive Devices: Grab bar or rail for support Toileting: 5: Supervision: Safety issues/verbal cues  FIM - Diplomatic Services operational officer Devices: Grab bars Toilet Transfers: 6-More than reasonable amt of time;5-To toilet/BSC: Supervision (verbal cues/safety issues);5-From toilet/BSC: Supervision (verbal cues/safety issues)  FIM - Press photographer Assistive Devices: Arm rests Bed/Chair Transfer: 4: Bed > Chair or W/C: Min A (steadying Pt. > 75%);5: Chair or W/C > Bed: Supervision (verbal cues/safety issues)  FIM - Locomotion: Wheelchair Locomotion: Wheelchair: 0: Activity did not occur FIM - Locomotion: Ambulation Locomotion: Ambulation Assistive Devices: Other (comment) (None) Ambulation/Gait Assistance: 4: Min guard Locomotion: Ambulation: 4: Travels 150 ft or more with minimal assistance (Pt.>75%)  Comprehension Comprehension Mode: Auditory Comprehension: 5-Follows basic conversation/direction: With no assist  Expression Expression Mode: Verbal Expression: 5-Expresses basic 90% of the time/requires cueing < 10% of the time.  Social Interaction Social Interaction: 6-Interacts appropriately with others with medication or extra time (anti-anxiety, antidepressant).  Problem Solving Problem Solving: 5-Solves basic 90% of the time/requires cueing < 10% of the time  Memory Memory: 4-Recognizes or recalls 75 - 89% of the time/requires cueing 10 - 24% of the time  Medical Problem List and Plan:  1. DVT Prophylaxis/Anticoagulation: Pharmaceutical: Lovenox  2. Pain Management: add ultram and neurontin may need to titrate upward for neuropathic pain 3. Mood: motivated to get better and home. Continue to provide ego support about progress. LCSW to follow for formal evaluation.  4. Neuropsych: This patient is capable of making decisions on his/her own behalf.  5. HTN: monitor with bid checks. Continue Norvasc. Monitor for need of additional agent.  6.  DM type 2: monitor with ac/hs. CBG checks. Continue levemir bid with SSI for elevated BS. Metformin on hold due to dye study.  7. Dyslipidemia: continue Lipitor daily.  8. Thyroid nodules: follow up on outpatient basis.  9. Microscopic polyangiitis: No meds X 3 years.   LOS (Days) 2 A FACE TO FACE EVALUATION WAS PERFORMED  Erick Colace 08/02/2012 2:51 PM

## 2012-08-02 NOTE — Progress Notes (Signed)
Physical Therapy Session Note  Patient Details  Name: Sandy Davis MRN: [id number] Date of Birth: 07/01/1956  Today's Date: 08/02/2012 Time: 11:16-12:00 ( )  Short Term Goals: Week 1:  PT Short Term Goal 1 (Week 1): = LTG secondary to LOS  Skilled Therapeutic Interventions/Progress Updates:  Tx focused on gait training, high-level balance with multi-tasking, and therex for increased activity tolerance.  Sit<>stand and stand-step transfers with S/Min A x6 throughout tx, increased effort required.  Gait training in controlled environment with Min-guard A 3x150' with cues for increased arm swing and R DF. Pt with rather guarded gait, but no LOB.   Balance training in busy environment while reaching and carrying objects across room without device and min A, placing at various heights, including floor retrieval. Pt needing multiple seated rest breaks due to fatigue. Pt challenged with multi-tasking tossing ball to self and kicking ball while ambulating in controlled environment 4x80' with Min A. Pt had 2 LOB, needing Min A to recover. Decreased coordination with R step noted during multi-tasking.   Nustep x32min with bil UE and LE, level 4. Pt needed 3 rest breaks due to fatigue and discomfort from tingling, but did report this task reduced her discomfort overall. After standing from Nustep, pt had moment of balance loss due to change in motor task, needing Min A to steady.   Pt left up in chair with all needs in reach, reminded to use call bell at all times when needing to get up.      Therapy Documentation Precautions:  Precautions Precautions: Fall Precaution Comments: impaired sensation R side and h/o peripheral neuropathy and DM: needs to wear shoes OOB when walking Restrictions Weight Bearing Restrictions: No  Pain: 6/10 neuropathic pain due to tingling in RUE. Modified treatment prn.   Locomotion : Ambulation Ambulation/Gait Assistance: 4: Min guard   See FIM for current  functional status  Therapy/Group: Individual Therapy  Clydene Laming, PT, DPT   08/02/2012, 11:51 AM

## 2012-08-02 NOTE — Progress Notes (Signed)
Reviewed and agree with the attached treatment note.  Amedio Bowlby, OTR/L 

## 2012-08-02 NOTE — Progress Notes (Signed)
Occupational Therapy Session Note  Patient Details  Name: Sandy Davis MRN: [id number] Date of Birth: 08-08-1955  Today's Date: 08/02/2012 Time: 4098-1191 Time Calculation (min): 45 min  Short Term Goals: Week 1:  OT Short Term Goal 1 (Week 1): LTG = STGs due to short ELOS  Skilled Therapeutic Interventions/Progress Updates:    Pt completed UB and LB bathing in shower in standing with shower chair available if needed with close supervision due to lack of safety awareness. Pt washed Rt and Lt foot in standing, requiring Min A to maintain balance. UB and LB dressing completed in sitting, including donning TED hose with supervision. Grooming completed in standing at sink with supervision. Pt gathered clothing and laundry including reaching into low drawers with close supervision for safety when reaching outside BOS. Noted pt furniture walked when ambulating in room to maintain balance.  Therapy Documentation Precautions:  Precautions Precautions: Fall Precaution Comments: impaired sensation R side and h/o peripheral neuropathy and DM: needs to wear shoes OOB when walking Restrictions Weight Bearing Restrictions: No    Pain: Pain Assessment: RUE and RLE tingling/pins and needles Pain Score:   3 Denied pain meidcation  See FIM for current functional status  Therapy/Group: Individual Therapy  Sunday Spillers 08/02/2012, 2:46 PM

## 2012-08-03 ENCOUNTER — Inpatient Hospital Stay (HOSPITAL_COMMUNITY): Payer: PRIVATE HEALTH INSURANCE | Admitting: Physical Therapy

## 2012-08-03 ENCOUNTER — Inpatient Hospital Stay (HOSPITAL_COMMUNITY): Payer: PRIVATE HEALTH INSURANCE | Admitting: Speech Pathology

## 2012-08-03 ENCOUNTER — Inpatient Hospital Stay (HOSPITAL_COMMUNITY): Payer: PRIVATE HEALTH INSURANCE | Admitting: Occupational Therapy

## 2012-08-03 DIAGNOSIS — G8110 Spastic hemiplegia affecting unspecified side: Secondary | ICD-10-CM

## 2012-08-03 DIAGNOSIS — I69998 Other sequelae following unspecified cerebrovascular disease: Secondary | ICD-10-CM

## 2012-08-03 DIAGNOSIS — I6340 Cerebral infarction due to embolism of unspecified cerebral artery: Secondary | ICD-10-CM

## 2012-08-03 DIAGNOSIS — I10 Essential (primary) hypertension: Secondary | ICD-10-CM

## 2012-08-03 DIAGNOSIS — R209 Unspecified disturbances of skin sensation: Secondary | ICD-10-CM

## 2012-08-03 MED ORDER — POLYETHYLENE GLYCOL 3350 17 G PO PACK
17.0000 g | PACK | Freq: Two times a day (BID) | ORAL | Status: DC
  Administered 2012-08-03: 17 g via ORAL
  Filled 2012-08-03 (×10): qty 1

## 2012-08-03 MED ORDER — GABAPENTIN 100 MG PO CAPS
200.0000 mg | ORAL_CAPSULE | Freq: Three times a day (TID) | ORAL | Status: DC
  Administered 2012-08-03 – 2012-08-04 (×3): 200 mg via ORAL
  Filled 2012-08-03 (×6): qty 2

## 2012-08-03 MED ORDER — SORBITOL 70 % SOLN
45.0000 mL | Freq: Once | Status: DC
  Filled 2012-08-03: qty 60

## 2012-08-03 MED ORDER — INSULIN DETEMIR 100 UNIT/ML ~~LOC~~ SOLN
20.0000 [IU] | Freq: Every day | SUBCUTANEOUS | Status: DC
  Administered 2012-08-03 – 2012-08-05 (×3): 20 [IU] via SUBCUTANEOUS
  Filled 2012-08-03: qty 10

## 2012-08-03 NOTE — Plan of Care (Signed)
Hypoglycemic Event  CBG:58  Treatment: 15 GM carbohydrate snack  Symptoms: None  Follow-up CBG: [id number] CBG Result:103  Possible Reasons for Event: Unknown  Comments/MD notified: Patient eating breakfast    Kirah Stice  Remember to initiate Hypoglycemia Order Set & complete

## 2012-08-03 NOTE — Progress Notes (Signed)
Patient ID: Sandy Davis, female   DOB: February 05, 1956, 57 y.o.   MRN: [id number] Subjective/Complaints: Continues to complain of tingling and numbness, we discussed Thalamic infarct as the etiology of these complaints.Tingling worse at night, keeps her up A 12 point review of systems has been performed and if not noted above is otherwise negative.   Objective: Vital Signs: Blood pressure 135/85, pulse 62, temperature 98.1 F (36.7 C), temperature source Oral, resp. rate 18, height 5\' 7"  (1.702 m), weight 98.3 kg (216 lb 11.4 oz), SpO2 95.00%. No results found.  Basename 08/01/12 0419  WBC 7.3  HGB 11.5*  HCT 35.4*  PLT 283    Basename 08/01/12 0419  NA 145  K 4.1  CL 107  GLUCOSE 87  BUN 12  CREATININE 0.72  CALCIUM 10.3   CBG (last 3)   Basename 08/01/12 0702 07/31/12 2102 07/31/12 1653  GLUCAP 79 165* 173*    Wt Readings from Last 3 Encounters:  07/31/12 98.3 kg (216 lb 11.4 oz)  07/30/12 98.657 kg (217 lb 8 oz)  07/30/12 98.657 kg (217 lb 8 oz)    Physical Exam:  Constitutional: She is oriented to person, place, and time. She appears well-developed and well-nourished.  HENT:  Head: Normocephalic and atraumatic.  Eyes: Pupils are equal, round, and reactive to light.  Neck: Normal range of motion.  Cardiovascular: Normal rate and regular rhythm. No murmur  Pulmonary/Chest: Effort normal and breath sounds normal.  Abdominal: Soft. Bowel sounds are normal.  Musculoskeletal: She exhibits no edema and no tenderness.  Neurological: She is alert and oriented to person, place, and time.  Follows full commands. Right central 7, minimal tongue deviation. Decreased LT over right face, arm, leg. RUE is 3 to 3+ at shouder, biceps, triceps, HI are 3-/5 with impaired FMC, ataxic. Right pronator drift. LE is 3/5 with impaired coordination. Good sitting balance. Speech is intelligible. Cognitively intact.  Sensation reduced on Right side face, arm and leg. Skin: Skin is warm and  dry.  Dry annular lesions on bilateral hands.  Psychiatric: She has a normal mood and affect. Her speech is normal and behavior is normal. Thought content normal. Cognition and memory are normal.    Assessment/Plan: 1. 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 Physiatrist is providing close team supervision and 24 hour management of active medical problems listed below. Physiatrist and rehab team continue to assess barriers to discharge/monitor patient progress toward functional and medical goals. FIM: FIM - Bathing Bathing Steps Patient Completed: Chest;Right Arm;Left Arm;Abdomen;Front perineal area;Buttocks;Right lower leg (including foot);Left lower leg (including foot);Left upper leg;Right upper leg Bathing: 4: Steadying assist  FIM - Upper Body Dressing/Undressing Upper body dressing/undressing steps patient completed: Thread/unthread right bra strap;Thread/unthread left bra strap;Hook/unhook bra;Thread/unthread right sleeve of pullover shirt/dresss;Thread/unthread left sleeve of pullover shirt/dress;Put head through opening of pull over shirt/dress;Pull shirt over trunk Upper body dressing/undressing: 6: More than reasonable amount of time FIM - Lower Body Dressing/Undressing Lower body dressing/undressing steps patient completed: Thread/unthread right underwear leg;Thread/unthread left underwear leg;Pull underwear up/down;Thread/unthread right pants leg;Thread/unthread left pants leg;Pull pants up/down;Fasten/unfasten pants;Don/Doff right sock;Don/Doff left sock;Don/Doff right shoe;Don/Doff left shoe Lower body dressing/undressing: 5: Supervision: Safety issues/verbal cues  FIM - Toileting Toileting steps completed by patient: Adjust clothing prior to toileting;Performs perineal hygiene;Adjust  clothing after toileting Toileting Assistive Devices: Grab bar or rail for support  Toileting: 5: Supervision: Safety issues/verbal cues  FIM - Diplomatic Services operational officer Devices: Grab bars Toilet Transfers: 6-More than reasonable amt of time;5-To toilet/BSC: Supervision (verbal cues/safety issues);5-From toilet/BSC: Supervision (verbal cues/safety issues)  FIM - Press photographer Assistive Devices: Arm rests Bed/Chair Transfer: 4: Bed > Chair or W/C: Min A (steadying Pt. > 75%);5: Chair or W/C > Bed: Supervision (verbal cues/safety issues)  FIM - Locomotion: Wheelchair Locomotion: Wheelchair: 0: Activity did not occur FIM - Locomotion: Ambulation Locomotion: Ambulation Assistive Devices: Other (comment) (None) Ambulation/Gait Assistance: 4: Min guard Locomotion: Ambulation: 4: Travels 150 ft or more with minimal assistance (Pt.>75%)  Comprehension Comprehension Mode: Auditory Comprehension: 5-Follows basic conversation/direction: With no assist  Expression Expression Mode: Verbal Expression: 5-Expresses basic 90% of the time/requires cueing < 10% of the time.  Social Interaction Social Interaction: 6-Interacts appropriately with others with medication or extra time (anti-anxiety, antidepressant).  Problem Solving Problem Solving: 5-Solves basic 90% of the time/requires cueing < 10% of the time  Memory Memory: 4-Recognizes or recalls 75 - 89% of the time/requires cueing 10 - 24% of the time  Medical Problem List and Plan:  1. DVT Prophylaxis/Anticoagulation: Pharmaceutical: Lovenox  2. Pain Management: add ultram and neurontin  titrate upward for neuropathic pain 3. Mood: motivated to get better and home. Continue to provide ego support about progress. LCSW to follow for formal evaluation.  4. Neuropsych: This patient is capable of making decisions on his/her own behalf.  5. HTN: monitor with bid checks. Continue Norvasc. Monitor for need  of additional agent.  6. DM type 2: monitor with ac/hs. CBG checks. Continue levemir bid with SSI for elevated BS. Metformin on hold due to dye study.  7. Dyslipidemia: continue Lipitor daily.  8. Thyroid nodules: follow up on outpatient basis.  9. Microscopic polyangiitis: No meds X 3 years.   LOS (Days) 3 A FACE TO FACE EVALUATION WAS PERFORMED  Claudette Laws E 08/03/2012 8:20 AM

## 2012-08-03 NOTE — Progress Notes (Signed)
Occupational Therapy Session Note  Patient Details  Name: Yoneko Talerico MRN: [id number] Date of Birth: 1955/12/10  Today's Date: 08/03/2012 Time: 1130-1215 Time Calculation (min): 45 min  Short Term Goals: Week 1:  OT Short Term Goal 1 (Week 1): LTG = STGs due to short ELOS  Skilled Therapeutic Interventions/Progress Updates:    Pt seen for 1:1 OT with focus on simple meal prep in ADL kitchen.  Pt gathered all necessary ingredients with SLP to prepare pound cake from scratch.  Pt required cues for sequencing and multiple references to recipe on box.  Pt supervision with ambulation in kitchen and appropriate use of kitchen tools to complete baking task.  Demonstrated appropriate safety awareness when putting cake in oven.  Plan to perform stove top cooking task to further assess safety with multi-tasking.   Therapy Documentation Precautions:  Precautions Precautions: Fall Precaution Comments: impaired sensation R side and h/o peripheral neuropathy and DM: needs to wear shoes OOB when walking Restrictions Weight Bearing Restrictions: No General:   Vital Signs: Therapy Vitals Temp: 98.2 F (36.8 C) Temp src: Oral Pulse Rate: 68  Resp: 18  BP: 137/82 mmHg Patient Position, if appropriate: Sitting Oxygen Therapy SpO2: 96 % O2 Device: None (Room air) Pain: Pain Assessment Pain Assessment: No/denies pain  See FIM for current functional status  Therapy/Group: Individual Therapy  Leonette Monarch 08/03/2012, 4:16 PM

## 2012-08-03 NOTE — Progress Notes (Signed)
Physical Therapy Session Note  Patient Details  Name: Sandy Davis MRN: [id number] Date of Birth: 08/13/1955  Today's Date: 08/03/2012 Time:1300-1400 and 1537-1600 Time Calculation (min): 23 min and 60 min  Short Term Goals: Week 1:  PT Short Term Goal 1 (Week 1): = LTG secondary to LOS  Skilled Therapeutic Interventions/Progress Updates:   First session:  Began with RLE coordination, motor control, grading training in sitting with RLE ball roll outs/in at varying speeds.  Gait training with focus on stair negotiation for home entry and exit up and down 3 stairs x 3-4 reps with bilat > no UE support on rails with alternating sequence for alternating LE coordination and sequencing with min A; continued gait training with alternating R and LLE toe taps to cones x 25' x 4 reps with no UE support but min-mod A to focus on RLE single limb stance and R hip flexion and ankle DF clearance during swing; gait training for UE and LE rhythmic coordination and trunk rotation with ambulation with walking poles x 50' x 2 reps with min A for UE and LE alternating sequence with improved coordination during second repetition.    Second session with focus on kitchen task of sequencing of removing cooled cake from pan with use of bilat UE to loosen cake and flip cake onto plates and use of bilat UE to cut cake into slices, place on plate and hand to staff members.  Patient also performed dual task gait in controlled environment >300' to carry plates of cake to various staff members and back to her room.  Discussed with patient that last day of therapy would be Monday and plan for f/u outpatient therapy.  Patient feels that she will be ready for home by Tuesday.  Therapy Documentation Precautions:  Precautions Precautions: Fall Precaution Comments: impaired sensation R side and h/o peripheral neuropathy and DM: needs to wear shoes OOB when walking Restrictions Weight Bearing Restrictions: No Vital Signs: Therapy  Vitals Temp: 98.2 F (36.8 C) Temp src: Oral Pulse Rate: 68  Resp: 18  BP: 137/82 mmHg Patient Position, if appropriate: Sitting Oxygen Therapy SpO2: 96 % O2 Device: None (Room air) Pain: Pain Assessment Pain Assessment: No/denies pain Locomotion : Ambulation Ambulation/Gait Assistance: 4: Min assist   See FIM for current functional status  Therapy/Group: Individual Therapy  Edman Circle [facility] 08/03/2012, 4:54 PM

## 2012-08-03 NOTE — Progress Notes (Signed)
Reviewed and agree with the attached treatment note.  Aishia Barkey, OTR/L 

## 2012-08-03 NOTE — Progress Notes (Signed)
Occupational Therapy Session Note  Patient Details  Name: Sandy Davis MRN: [id number] Date of Birth: Mar 05, 1956  Today's Date: 08/03/2012 Time: 0815-0859 Time Calculation (min): 44 min  Short Term Goals: Week 1:  OT Short Term Goal 1 (Week 1): LTG = STGs due to short ELOS  Skilled Therapeutic Interventions/Progress Updates:    Pt seen for ADL retraining session with focus on dynamic standing balance and safety awareness during bathing and dressing tasks. Pt completed UB and LB bathing in shower in standing with shower chair removed with close supervision due to decreased safety awareness.  UB and LB dressing completed in sitting, including donning TED hose with supervision when standing to pull up pants. Grooming completed in standing at sink with supervision. Pt gathered clothing and laundry including reaching into low drawers with close supervision for safety when reaching outside BOS. Pt education on safety during ambulation to decrease furniture walking.   Therapy Documentation Precautions:  Precautions Precautions: Fall Precaution Comments: impaired sensation R side and h/o peripheral neuropathy and DM: needs to wear shoes OOB when walking Restrictions Weight Bearing Restrictions: No   Pain: No pain reported this session  See FIM for current functional status  Therapy/Group: Individual Therapy  Sunday Spillers 08/03/2012, 9:39 AM

## 2012-08-03 NOTE — Progress Notes (Signed)
Speech Language Pathology Daily Session Note  Patient Details  Name: Sandy Davis MRN: [id number] Date of Birth: [birth year]  Today's Date: 08/03/2012 Time: 1105-1130 Time Calculation (min): 25 min  Short Term Goals: Week 1: SLP Short Term Goal 1 (Week 1): Patient will participate in discharge planning by predicting needs after discharge with supervision level verbal cues. SLP Short Term Goal 2 (Week 1): Patient will utilize word finind strategies during converastion with supervision level verbal cues. SLP Short Term Goal 3 (Week 1): Patient will solve complex problems with supervision level verbal cues.  Skilled Therapeutic Interventions: Skilled treatment session focused on addressing cognitive goals while preparing for a cooking task. SLP facilitated session with education regarding use of memory strategies during set up of activity.  Patient utilized check off system with recipe and needed cooking items with mod assist cues to organize and self-monitor set up.  Patient demonstrated very limited recall of locations of items from yesterday.  SLP is recommending supervision assist at discharge with need for assist with complex tasks.      FIM:  Comprehension Comprehension Mode: Auditory Comprehension: 5-Understands complex 90% of the time/Cues < 10% of the time Expression Expression Mode: Verbal Expression: 5-Expresses basic needs/ideas: With extra time/assistive device Social Interaction Social Interaction: 6-Interacts appropriately with others with medication or extra time (anti-anxiety, antidepressant). Problem Solving Problem Solving: 5-Solves basic 90% of the time/requires cueing < 10% of the time Memory Memory: 4-Recognizes or recalls 75 - 89% of the time/requires cueing 10 - 24% of the time FIM - Eating Eating Activity: 7: Complete independence:no helper  Pain Pain Assessment Pain Assessment: No/denies pain Pain Score:   4 Pain Type: Neuropathic pain Pain Location:  Arm Pain Orientation: Right Pain Descriptors: Numbness;Tingling Pain Frequency: Constant Pain Onset: On-going Patients Stated Pain Goal: 3 Pain Intervention(s): Medication (See eMAR)  Therapy/Group: Individual Therapy  Charlane Ferretti., CCC-SLP [phone]  Faryn Sieg 08/03/2012, 12:32 PM

## 2012-08-04 ENCOUNTER — Inpatient Hospital Stay (HOSPITAL_COMMUNITY): Payer: PRIVATE HEALTH INSURANCE | Admitting: *Deleted

## 2012-08-04 ENCOUNTER — Inpatient Hospital Stay (HOSPITAL_COMMUNITY): Payer: PRIVATE HEALTH INSURANCE | Admitting: Occupational Therapy

## 2012-08-04 ENCOUNTER — Inpatient Hospital Stay (HOSPITAL_COMMUNITY): Payer: PRIVATE HEALTH INSURANCE | Admitting: Speech Pathology

## 2012-08-04 ENCOUNTER — Inpatient Hospital Stay (HOSPITAL_COMMUNITY): Payer: PRIVATE HEALTH INSURANCE | Admitting: Physical Therapy

## 2012-08-04 MED ORDER — GABAPENTIN 300 MG PO CAPS
300.0000 mg | ORAL_CAPSULE | Freq: Three times a day (TID) | ORAL | Status: DC
  Administered 2012-08-04 – 2012-08-05 (×3): 300 mg via ORAL
  Filled 2012-08-04 (×6): qty 1

## 2012-08-04 NOTE — Progress Notes (Addendum)
Physical Therapy Note  Patient Details  Name: Sandy Davis MRN: [id number] Date of Birth: December 18, 1955 Today's Date: 08/04/2012  1000-1055 (55 minutes) group Pain: no complaint of pain Pt participated in PT group session focused on gait training/safety/endurance. Gait to/from room to gym 120 feet close SBA ; Nustep Level 4 X 10 minutes for activity tolerance /RT extremity control; Alternate stepping to bench height for RT hip flexion strengthening/standing balance; Standing (in parallel bars) RT hip flexion, knee flexion, hip abduction with 4 # s  X 15; up/down 6 inch step RT LE only for quad control (muscle fatigue at 10 reps) .  1530-1600 (30 minutes) individual Pain: no complaint of pain Focus of treatment: Therapeutic activity focused on RT LE hip/knee control in standing/ standing balance during weight shifts Treatment: Gait 120 feet X 2 SBA varying gait speed to increased RT LE reaction time; Kinetron in standing 3 X 20 with/without UE support SBA.   Stedman [place],JIM 08/04/2012, 7:44 AM

## 2012-08-04 NOTE — Progress Notes (Signed)
Patient ID: Sommer Spickard, female   DOB: 07-25-1955, 57 y.o.   MRN: [id number] Subjective/Complaints: Continues to complain of tingling and numbness, we discussed Thalamic infarct as the etiology of these complaints.Tingling worse at night, keeps her up.  Some sensations bordering on painful.  Discussed Thalamic pain syndrome A 12 point review of systems has been performed and if not noted above is otherwise negative.   Objective: Vital Signs: Blood pressure 134/88, pulse 80, temperature 98.4 F (36.9 C), temperature source Oral, resp. rate 18, height 5\' 7"  (1.702 m), weight 98.3 kg (216 lb 11.4 oz), SpO2 94.00%. No results found. No results found for this basename: WBC:2,HGB:2,HCT:2,PLT:2 in the last 72 hours No results found for this basename: NA:2,K:2,CL:2,CO:2,GLUCOSE:2,BUN:2,CREATININE:2,CALCIUM:2 in the last 72 hours CBG (last 3)  No results found for this basename: GLUCAP:3 in the last 72 hours  Wt Readings from Last 3 Encounters:  07/31/12 98.3 kg (216 lb 11.4 oz)  07/30/12 98.657 kg (217 lb 8 oz)  07/30/12 98.657 kg (217 lb 8 oz)    Physical Exam:  Constitutional: She is oriented to person, place, and time. She appears well-developed and well-nourished.  HENT:  Head: Normocephalic and atraumatic.  Eyes: Pupils are equal, round, and reactive to light.  Neck: Normal range of motion.  Cardiovascular: Normal rate and regular rhythm. No murmur  Pulmonary/Chest: Effort normal and breath sounds normal.  Abdominal: Soft. Bowel sounds are normal.  Musculoskeletal: She exhibits no edema and no tenderness.  Neurological: She is alert and oriented to person, place, and time.  Follows full commands. Right central 7, minimal tongue deviation. Decreased LT over right face, arm, leg. RUE is 3 to 3+ at shouder, biceps, triceps, HI are 3-/5 with impaired FMC, ataxic. Right pronator drift. LE is 3/5 with impaired coordination. Good sitting balance. Speech is intelligible. Cognitively intact.   Sensation reduced on Right side face, arm and leg. Skin: Skin is warm and dry.  Dry annular lesions on bilateral hands.  Psychiatric: She has a normal mood and affect. Her speech is normal and behavior is normal. Thought content normal. Cognition and memory are normal.    Assessment/Plan: 1. 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 Physiatrist is providing close team supervision and 24 hour management of active medical problems listed below. Physiatrist and rehab team continue to assess barriers to discharge/monitor patient progress toward functional and medical goals. FIM: FIM - Bathing Bathing Steps Patient Completed: Chest;Right Arm;Left lower leg (including foot);Left Arm;Abdomen;Front perineal area;Buttocks;Right upper leg;Left upper leg;Right lower leg (including foot) Bathing: 5: Supervision: Safety issues/verbal cues  FIM - Upper Body Dressing/Undressing Upper body dressing/undressing steps patient completed: Thread/unthread right bra strap;Thread/unthread left bra strap;Hook/unhook bra;Thread/unthread right sleeve of pullover shirt/dresss;Thread/unthread left sleeve of pullover shirt/dress;Put head through opening of pull over shirt/dress;Pull shirt over trunk Upper body dressing/undressing: 5: Supervision: Safety issues/verbal cues FIM - Lower Body Dressing/Undressing Lower body dressing/undressing steps patient completed: Thread/unthread right underwear leg;Thread/unthread left underwear leg;Pull underwear up/down;Thread/unthread right pants leg;Thread/unthread left pants leg;Pull pants up/down;Fasten/unfasten right shoe;Fasten/unfasten left shoe;Fasten/unfasten pants;Don/Doff right sock;Don/Doff left sock;Don/Doff right shoe;Don/Doff left shoe Lower body dressing/undressing: 5: Supervision:  Safety issues/verbal cues  FIM - Toileting Toileting steps completed by patient: Adjust clothing prior to toileting;Performs perineal hygiene;Adjust clothing after toileting Toileting Assistive Devices: Grab bar or rail for support Toileting: 0: Activity did not occur  FIM -  Diplomatic Services operational officer Devices: Therapist, music Transfers: 0-Activity did not occur  FIM - Banker Devices: Arm rests Bed/Chair Transfer: 4: Bed > Chair or W/C: Min A (steadying Pt. > 75%);4: Chair or W/C > Bed: Min A (steadying Pt. > 75%)  FIM - Locomotion: Wheelchair Locomotion: Wheelchair: 0: Activity did not occur FIM - Locomotion: Ambulation Locomotion: Ambulation Assistive Devices: Other (comment) (None) Ambulation/Gait Assistance: 4: Min assist Locomotion: Ambulation: 4: Travels 150 ft or more with minimal assistance (Pt.>75%)  Comprehension Comprehension Mode: Auditory Comprehension: 5-Understands complex 90% of the time/Cues < 10% of the time  Expression Expression Mode: Verbal Expression: 5-Expresses basic needs/ideas: With extra time/assistive device  Social Interaction Social Interaction: 6-Interacts appropriately with others with medication or extra time (anti-anxiety, antidepressant).  Problem Solving Problem Solving: 5-Solves basic 90% of the time/requires cueing < 10% of the time  Memory Memory: 4-Recognizes or recalls 75 - 89% of the time/requires cueing 10 - 24% of the time  Medical Problem List and Plan:  1. DVT Prophylaxis/Anticoagulation: Pharmaceutical: Lovenox  2. Pain Management: add ultram and neurontin  titrate upward for neuropathic pain 3. Mood: motivated to get better and home. Continue to provide ego support about progress. LCSW to follow for formal evaluation.  4. Neuropsych: This patient is capable of making decisions on his/her own behalf.  5. HTN: monitor with bid checks. Continue Norvasc. Monitor for need  of additional agent.  6. DM type 2: monitor with ac/hs. CBG checks. Continue levemir bid with SSI for elevated BS. Metformin on hold due to dye study.  7. Dyslipidemia: continue Lipitor daily.  8. Thyroid nodules: follow up on outpatient basis.  9. Microscopic polyangiitis: No meds X 3 years.   LOS (Days) 4 A FACE TO FACE EVALUATION WAS PERFORMED  Erick Colace 08/04/2012 9:56 AM

## 2012-08-04 NOTE — Progress Notes (Addendum)
Speech Language Pathology Daily Session Note  Patient Details  Name: Sandy Davis MRN: [id number] Date of Birth: February 21, 1956  Today's Date: 08/04/2012 Time: 1415-1500 Time Calculation (min): 45 min  Short Term Goals: Week 1: SLP Short Term Goal 1 (Week 1): Patient will participate in discharge planning by predicting needs after discharge with supervision level verbal cues. SLP Short Term Goal 2 (Week 1): Patient will utilize word finind strategies during converastion with supervision level verbal cues. SLP Short Term Goal 3 (Week 1): Patient will solve complex problems with supervision level verbal cues.  Skilled Therapeutic Interventions: Skilled treatment session focused on addressing cognition during self-care task and family education.  SLP facilitated session with verbal list of 3 items to recall and locate on unit and min assist verbal cues to utilize working memory strategies during task as well as supervision level verbal cues to utilize external aids to assist.  SLP also facilitated session by educating family regarding recommendations for discharge and memory strategies.  Family reported that patient's memory deficits are slightly worse since CVA and all parties agree to no follow-up services post CIR stay.  Of note, no word finding difficulties were observed during session.  FIM:  Comprehension Comprehension Mode: Auditory Comprehension: 6-Follows complex conversation/direction: With extra time/assistive device Expression Expression Mode: Verbal Expression: 6-Expresses complex ideas: With extra time/assistive device Social Interaction Social Interaction: 6-Interacts appropriately with others with medication or extra time (anti-anxiety, antidepressant). Problem Solving Problem Solving: 5-Solves complex 90% of the time/cues < 10% of the time Memory Memory: 4-Recognizes or recalls 75 - 89% of the time/requires cueing 10 - 24% of the time  Pain Pain Assessment Pain Assessment:  No/denies pain  Therapy/Group: Individual Therapy  Charlane Ferretti., CCC-SLP [phone]  Sandy Davis 08/04/2012, 3:18 PM

## 2012-08-04 NOTE — Progress Notes (Signed)
Occupational Therapy Session Note  Patient Details  Name: Sandy Davis MRN: [id number] Date of Birth: [birth year]  Today's Date: 08/04/2012 Time:  - 4098-119 Total Time in minutes =45   Skilled Therapeutic Interventions/Progress Updates: Patient completed ADL in room including standing shower and clothing retrieval with focus on challenged balance and functional mobility.  Patient stated, "I am very independent, but they do not want me to move around in here by myself.  Patient exhibited good balance dynamic and during functional ambulation in spite of complaints of tingling in right leg and foot    Therapy Documentation Precautions:  Precautions Precautions: Fall Precaution Comments: impaired sensation R side and h/o peripheral neuropathy and DM: needs to wear shoes OOB when walking Restrictions Weight Bearing Restrictions: No  Pain:7/10 right side of body from face to toe (painful tingling) RN had given pain meds prior  Therapy/Group: Individual Therapy  Bud Face [facility] 08/04/2012, 9:38 AM

## 2012-08-05 ENCOUNTER — Inpatient Hospital Stay (HOSPITAL_COMMUNITY): Payer: PRIVATE HEALTH INSURANCE | Admitting: *Deleted

## 2012-08-05 MED ORDER — INSULIN DETEMIR 100 UNIT/ML ~~LOC~~ SOLN
18.0000 [IU] | Freq: Once | SUBCUTANEOUS | Status: AC
  Administered 2012-08-05: 18 [IU] via SUBCUTANEOUS

## 2012-08-05 MED ORDER — GABAPENTIN 300 MG PO CAPS
300.0000 mg | ORAL_CAPSULE | Freq: Three times a day (TID) | ORAL | Status: DC
  Administered 2012-08-05 (×3): 300 mg via ORAL
  Filled 2012-08-05 (×8): qty 1

## 2012-08-05 NOTE — Plan of Care (Signed)
Problem: RH BOWEL ELIMINATION Goal: RH STG MANAGE BOWEL WITH ASSISTANCE STG Manage Bowel with Mod I Assistance.  Outcome: Not Progressing Requiring miralax and sorbitol for bowel management Goal: RH STG MANAGE BOWEL W/MEDICATION W/ASSISTANCE STG Manage Bowel with Medication with Min Assistance.  Outcome: Not Progressing Still requirse meds for BM

## 2012-08-05 NOTE — Progress Notes (Signed)
Patient ID: Sandy Davis, female   DOB: Apr 29, 1956, 57 y.o.   MRN: [id number] Subjective/Complaints: Continues to complain of tingling and numbness, we discussed Thalamic infarct as the etiology of these complaints.Tingling worse at night, keeps her up.  Some sensations bordering on painful.  Discussed Thalamic pain syndrome, improvements overall on gabapentin A 12 point review of systems has been performed and if not noted above is otherwise negative.   Objective: Vital Signs: Blood pressure 135/80, pulse 64, temperature 97.7 F (36.5 C), temperature source Oral, resp. rate 19, height 5\' 7"  (1.702 m), weight 98.3 kg (216 lb 11.4 oz), SpO2 95.00%. No results found. No results found for this basename: WBC:2,HGB:2,HCT:2,PLT:2 in the last 72 hours No results found for this basename: NA:2,K:2,CL:2,CO:2,GLUCOSE:2,BUN:2,CREATININE:2,CALCIUM:2 in the last 72 hours CBG (last 3)  No results found for this basename: GLUCAP:3 in the last 72 hours  Wt Readings from Last 3 Encounters:  07/31/12 98.3 kg (216 lb 11.4 oz)  07/30/12 98.657 kg (217 lb 8 oz)  07/30/12 98.657 kg (217 lb 8 oz)    Physical Exam:  Constitutional: She is oriented to person, place, and time. She appears well-developed and well-nourished.  HENT:  Head: Normocephalic and atraumatic.  Eyes: Pupils are equal, round, and reactive to light.  Neck: Normal range of motion.  Cardiovascular: Normal rate and regular rhythm. No murmur  Pulmonary/Chest: Effort normal and breath sounds normal.  Abdominal: Soft. Bowel sounds are normal.  Musculoskeletal: She exhibits no edema and no tenderness.  Neurological: She is alert and oriented to person, place, and time.  Follows full commands. Right central 7, minimal tongue deviation. Decreased LT over right face, arm, leg. RUE is 3 to 3+ at shouder, biceps, triceps, HI are 3-/5 with impaired FMC, ataxic. Right pronator drift. LE is 3/5 with impaired coordination. Good sitting balance. Speech is  intelligible. Cognitively intact.  Sensation reduced on Right side face, arm and leg. Skin: Skin is warm and dry.  Dry annular lesions on bilateral hands.  Psychiatric: She has a normal mood and affect. Her speech is normal and behavior is normal. Thought content normal. Cognition and memory are normal.    Assessment/Plan: 1. 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 Physiatrist is providing close team supervision and 24 hour management of active medical problems listed below. Physiatrist and rehab team continue to assess barriers to discharge/monitor patient progress toward functional and medical goals. FIM: FIM - Bathing Bathing Steps Patient Completed: Chest;Right Arm;Left lower leg (including foot);Left Arm;Abdomen;Front perineal area;Buttocks;Right upper leg;Left upper leg;Right lower leg (including foot) Bathing: 5: Supervision: Safety issues/verbal cues  FIM - Upper Body Dressing/Undressing Upper body dressing/undressing steps patient completed: Thread/unthread right bra strap;Thread/unthread left bra strap;Hook/unhook bra;Thread/unthread right sleeve of pullover shirt/dresss;Thread/unthread left sleeve of pullover shirt/dress;Put head through opening of pull over shirt/dress;Pull shirt over trunk Upper body dressing/undressing: 5: Supervision: Safety issues/verbal cues FIM - Lower Body Dressing/Undressing Lower body dressing/undressing steps patient completed: Thread/unthread right underwear leg;Thread/unthread left underwear leg;Pull underwear up/down;Thread/unthread right pants leg;Thread/unthread left pants leg;Pull pants up/down;Fasten/unfasten right shoe;Fasten/unfasten left shoe;Fasten/unfasten pants;Don/Doff right sock;Don/Doff left sock;Don/Doff right shoe;Don/Doff left shoe Lower body  dressing/undressing: 5: Supervision: Safety issues/verbal cues  FIM - Toileting Toileting steps completed by patient: Adjust clothing prior to toileting;Performs perineal hygiene;Adjust clothing after toileting Toileting Assistive Devices: Grab bar or rail for support Toileting: 0: Activity did not  occur  FIM - Diplomatic Services operational officer Devices: Grab bars Toilet Transfers: 0-Activity did not occur  FIM - Banker Devices: Arm rests Bed/Chair Transfer: 5: Bed > Chair or W/C: Supervision (verbal cues/safety issues)  FIM - Locomotion: Wheelchair Locomotion: Wheelchair: 0: Activity did not occur FIM - Locomotion: Ambulation Locomotion: Ambulation Assistive Devices: Other (comment) (None) Ambulation/Gait Assistance: 4: Min assist Locomotion: Ambulation: 4: Travels 150 ft or more with minimal assistance (Pt.>75%)  Comprehension Comprehension Mode: Auditory Comprehension: 5-Understands basic 90% of the time/requires cueing < 10% of the time  Expression Expression Mode: Verbal Expression: 5-Expresses basic 90% of the time/requires cueing < 10% of the time.  Social Interaction Social Interaction: 6-Interacts appropriately with others with medication or extra time (anti-anxiety, antidepressant).  Problem Solving Problem Solving: 5-Solves complex 90% of the time/cues < 10% of the time  Memory Memory: 4-Recognizes or recalls 75 - 89% of the time/requires cueing 10 - 24% of the time  Medical Problem List and Plan:  1. DVT Prophylaxis/Anticoagulation: Pharmaceutical: Lovenox  2. Pain Management: add ultram and neurontin  titrate upward for neuropathic pain 3. Mood: motivated to get better and home. Continue to provide ego support about progress. LCSW to follow for formal evaluation.  4. Neuropsych: This patient is capable of making decisions on his/her own behalf.  5. HTN: monitor with bid checks. Continue Norvasc. Monitor for  need of additional agent.  6. DM type 2: monitor with ac/hs. CBG checks. Continue levemir bid with SSI for elevated BS. Metformin on hold due to dye study.  7. Dyslipidemia: continue Lipitor daily.  8. Thyroid nodules: follow up on outpatient basis.  9. Microscopic polyangiitis: No meds X 3 years.   LOS (Days) 5 A FACE TO FACE EVALUATION WAS PERFORMED  Erick Colace 08/05/2012 10:39 AM

## 2012-08-05 NOTE — Significant Event (Signed)
hypHypoglycemic Event  CBG: 64  Treatment: 15 GM carbohydrate snack  Symptoms: None  Follow-up CBG: Time:0930 CBG Result:169  Possible Reasons for Event: Medication regimen: Levamir BID  Comments/MD notified: Reviewed meds with pt; pt ate all of bkfas. + 15 gm of soda, tMD notified of CBG result; new order for 18 units levamir today    Chana Bode B  Remember to initiate Hypoglycemia Order Set & complete

## 2012-08-05 NOTE — Significant Event (Signed)
Hypoglycemic Event  CBG: 51  Treatment: 15 GM carbohydrate snack  Symptoms: None  Follow-up CBG: Time:0759 CBG Result:64  Possible Reasons for Event: Medication regimen: Levamir BID  Comments/MD notified:    Pamelia Hoit  Remember to initiate Hypoglycemia Order Set & complete

## 2012-08-05 NOTE — Progress Notes (Signed)
Physical Therapy Note  Patient Details  Name: Journey Castonguay MRN: [id number] Date of Birth: [birth year] Today's Date: 08/05/2012  1300-1355 (55 minutes) group Pain: no complaint of pain Pt participated in PT group session focused on gait safety/training/endurance. Gait training on treadmill to improve RT Le reaction time during swing X 2 at 1.0 MPH/ 1.2 MPH for 3 minutes; gait to/from room 120 feet close SBA; Nustep Level 4 X 10 minutes for activity tolerance.    Jaelee Laughter,JIM 08/05/2012, 7:56 AM

## 2012-08-06 ENCOUNTER — Inpatient Hospital Stay (HOSPITAL_COMMUNITY): Payer: PRIVATE HEALTH INSURANCE | Admitting: Occupational Therapy

## 2012-08-06 ENCOUNTER — Inpatient Hospital Stay (HOSPITAL_COMMUNITY): Payer: PRIVATE HEALTH INSURANCE | Admitting: Physical Therapy

## 2012-08-06 ENCOUNTER — Inpatient Hospital Stay (HOSPITAL_COMMUNITY): Payer: PRIVATE HEALTH INSURANCE | Admitting: Speech Pathology

## 2012-08-06 LAB — GLUCOSE, CAPILLARY: Glucose-Capillary: 143 mg/dL — ABNORMAL HIGH (ref 70–99)

## 2012-08-06 LAB — BASIC METABOLIC PANEL
BUN: 14 mg/dL (ref 6–23)
Chloride: 106 mEq/L (ref 96–112)
Creatinine, Ser: 0.97 mg/dL (ref 0.50–1.10)
GFR calc Af Amer: 74 mL/min — ABNORMAL LOW (ref >90)
Glucose, Bld: 127 mg/dL — ABNORMAL HIGH (ref 70–99)
Potassium: 4.5 mEq/L (ref 3.5–5.1)

## 2012-08-06 MED ORDER — INSULIN DETEMIR 100 UNIT/ML ~~LOC~~ SOLN
18.0000 [IU] | Freq: Every day | SUBCUTANEOUS | Status: DC
  Administered 2012-08-06 – 2012-08-07 (×2): 18 [IU] via SUBCUTANEOUS

## 2012-08-06 MED ORDER — GABAPENTIN 400 MG PO CAPS
400.0000 mg | ORAL_CAPSULE | Freq: Three times a day (TID) | ORAL | Status: DC
  Administered 2012-08-06 – 2012-08-07 (×5): 400 mg via ORAL
  Filled 2012-08-06 (×8): qty 1

## 2012-08-06 MED ORDER — INSULIN DETEMIR 100 UNIT/ML ~~LOC~~ SOLN
18.0000 [IU] | Freq: Every day | SUBCUTANEOUS | Status: DC
  Administered 2012-08-06: 18 [IU] via SUBCUTANEOUS

## 2012-08-06 NOTE — Progress Notes (Signed)
Patient ID: Sandy Davis, female   DOB: 1956/02/22, 57 y.o.   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  Discussed Thalamic pain syndrome, improvements overall on gabapentin A 12 point review of systems has been performed and if not noted above is otherwise negative.   Objective: Vital Signs: Blood pressure 122/77, pulse 65, temperature 98.3 F (36.8 C), temperature source Oral, resp. rate 19, height 5\' 7"  (1.702 m), weight 98.3 kg (216 lb 11.4 oz), SpO2 96.00%. No results found. No results found for this basename: WBC:2,HGB:2,HCT:2,PLT:2 in the last 72 hours No results found for this basename: NA:2,K:2,CL:2,CO:2,GLUCOSE:2,BUN:2,CREATININE:2,CALCIUM:2 in the last 72 hours CBG (last 3)  No results found for this basename: GLUCAP:3 in the last 72 hours  Wt Readings from Last 3 Encounters:  07/31/12 98.3 kg (216 lb 11.4 oz)  07/30/12 98.657 kg (217 lb 8 oz)  07/30/12 98.657 kg (217 lb 8 oz)    Physical Exam:  Constitutional: She is oriented to person, place, and time. She appears well-developed and well-nourished.  HENT:  Head: Normocephalic and atraumatic.  Eyes: Pupils are equal, round, and reactive to light.  Neck: Normal range of motion.  Cardiovascular: Normal rate and regular rhythm. No murmur  Pulmonary/Chest: Effort normal and breath sounds normal.  Abdominal: Soft. Bowel sounds are normal.  Musculoskeletal: She exhibits no edema and no tenderness.  Neurological: She is alert and oriented to person, place, and time.  Follows full commands. Right central 7, minimal tongue deviation. Decreased LT over right face, arm, leg. RUE is 3 to 3+ at shouder, biceps, triceps, HI are 3-/5 with impaired FMC, ataxic. Right pronator drift. LE is 3/5 with impaired coordination. Good sitting balance. Speech  is intelligible. Cognitively intact.  Sensation reduced on Right side face, arm and leg. Skin: Skin is warm and dry.  Dry annular lesions on bilateral hands.  Psychiatric: She has a normal mood and affect. Her speech is normal and behavior is normal. Thought content normal. Cognition and memory are normal.    Assessment/Plan: 1. 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 Physiatrist is providing close team supervision and 24 hour management of active medical problems listed below. Physiatrist and rehab team continue to assess barriers to discharge/monitor patient progress toward functional and medical goals. FIM: FIM - Bathing Bathing Steps Patient Completed: Chest;Right Arm;Left lower leg (including foot);Left Arm;Abdomen;Front perineal area;Buttocks;Right upper leg;Left upper leg;Right lower leg (including foot) Bathing: 5: Supervision: Safety issues/verbal cues  FIM - Upper Body Dressing/Undressing Upper body dressing/undressing steps patient completed: Thread/unthread right bra strap;Thread/unthread left bra strap;Hook/unhook bra;Thread/unthread right sleeve of pullover shirt/dresss;Thread/unthread left sleeve of pullover shirt/dress;Put head through opening of pull over shirt/dress;Pull shirt over trunk Upper body dressing/undressing: 5: Supervision: Safety issues/verbal cues FIM - Lower Body Dressing/Undressing Lower body dressing/undressing steps patient completed: Thread/unthread right underwear leg;Thread/unthread left underwear leg;Pull underwear up/down;Thread/unthread right pants leg;Thread/unthread left pants leg;Pull pants up/down;Fasten/unfasten right shoe;Fasten/unfasten left shoe;Fasten/unfasten pants;Don/Doff right sock;Don/Doff left sock;Don/Doff right shoe;Don/Doff left shoe Lower body  dressing/undressing: 5: Supervision: Safety issues/verbal cues  FIM - Toileting Toileting steps completed by patient: Adjust clothing prior to toileting;Performs perineal hygiene;Adjust clothing after toileting Toileting Assistive Devices: Grab bar or rail for support Toileting: 0: Activity did  not occur  FIM - Diplomatic Services operational officer Devices: Grab bars Toilet Transfers: 0-Activity did not occur  FIM - Banker Devices: Arm rests Bed/Chair Transfer: 5: Bed > Chair or W/C: Supervision (verbal cues/safety issues)  FIM - Locomotion: Wheelchair Locomotion: Wheelchair: 0: Activity did not occur FIM - Locomotion: Ambulation Locomotion: Ambulation Assistive Devices: Other (comment) (None) Ambulation/Gait Assistance: 4: Min assist Locomotion: Ambulation: 4: Travels 150 ft or more with minimal assistance (Pt.>75%)  Comprehension Comprehension Mode: Auditory Comprehension: 6-Follows complex conversation/direction: With extra time/assistive device  Expression Expression Mode: Verbal Expression: 6-Expresses complex ideas: With extra time/assistive device  Social Interaction Social Interaction: 6-Interacts appropriately with others with medication or extra time (anti-anxiety, antidepressant).  Problem Solving Problem Solving: 5-Solves complex 90% of the time/cues < 10% of the time  Memory Memory: 4-Recognizes or recalls 75 - 89% of the time/requires cueing 10 - 24% of the time  Medical Problem List and Plan:  1. DVT Prophylaxis/Anticoagulation: Pharmaceutical: Lovenox  2. Pain Management: add ultram and neurontin  titrate upward for neuropathic pain 3. Mood: motivated to get better and home. Continue to provide ego support about progress. LCSW to follow for formal evaluation.  4. Neuropsych: This patient is capable of making decisions on his/her own behalf.  5. HTN: monitor with bid checks. Continue Norvasc. Monitor for need of  additional agent.  6. DM type 2: monitor with ac/hs. CBG checks. Continue levemir bid with SSI for elevated BS. Metformin on hold due to dye study.  7. Dyslipidemia: continue Lipitor daily.  8. Thyroid nodules: follow up on outpatient basis.  9. Microscopic polyangiitis: No meds X 3 years.   LOS (Days) 6 A FACE TO FACE EVALUATION WAS PERFORMED  Erick Colace 08/06/2012 7:50 AM

## 2012-08-06 NOTE — Progress Notes (Signed)
Reviewed and agree with the attached treatment note.  Jennefer Kopp, OTR/L 

## 2012-08-06 NOTE — Progress Notes (Signed)
Occupational Therapy Session Note  Patient Details  Name: Sandy Davis MRN: [id number] Date of Birth: Jun 26, 1956  Today's Date: 08/06/2012 Time: 3086-5784 Time Calculation (min): 44 min  Short Term Goals: Week 1:  OT Short Term Goal 1 (Week 1): LTG = STGs due to short ELOS  Skilled Therapeutic Interventions/Progress Updates:    Pt seen for ADL session with focus on education on safety awareness and energy conservation techniques at home. Pt completed ADLs at overall Mod I level. Discussed DME needed for safety during self care at home. Pt requested shower chair for energy conservation during bathing. Pt education on safety awareness due to decreased balance and energy conservation techniques due to decreased sensation and weakness in RUE.  Therapy Documentation Precautions:  Precautions Precautions: Fall Precaution Comments: impaired sensation R side and h/o peripheral neuropathy and DM: needs to wear shoes OOB when walking Restrictions Weight Bearing Restrictions: No :   Pain:  No pain reported this session  See FIM for current functional status  Therapy/Group: Individual Therapy  Sunday Spillers 08/06/2012, 9:31 AM

## 2012-08-06 NOTE — Progress Notes (Signed)
Speech Language Pathology Daily Session Note  Patient Details  Name: Magalene Mclear MRN: [id number] Date of Birth: 1956-04-02  Today's Date: 08/06/2012 Time: 1030-1100 Time Calculation (min): 30 min  Short Term Goals: Week 1: SLP Short Term Goal 1 (Week 1): Patient will participate in discharge planning by predicting needs after discharge with supervision level verbal cues. SLP Short Term Goal 2 (Week 1): Patient will utilize word finind strategies during converastion with supervision level verbal cues. SLP Short Term Goal 3 (Week 1): Patient will solve complex problems with supervision level verbal cues.  Skilled Therapeutic Interventions: Skilled treatment session focused on addressing cognition during self-care task and completing patient education. SLP facilitated session with discussion regarding needs after discharge and patient was mod I with verbally anticipating needs.  SLP and patient re-discussed recommendations for discharge and use of memory strategies with family's assist. Patient verbalized understanding.     FIM:  Comprehension Comprehension Mode: Auditory Comprehension: 6-Follows complex conversation/direction: With extra time/assistive device Expression Expression Mode: Verbal Expression: 6-Expresses complex ideas: With extra time/assistive device Social Interaction Social Interaction: 6-Interacts appropriately with others with medication or extra time (anti-anxiety, antidepressant). Problem Solving Problem Solving: 5-Solves complex 90% of the time/cues < 10% of the time Memory Memory: 5-Recognizes or recalls 90% of the time/requires cueing < 10% of the time FIM - Eating Eating Activity: 7: Complete independence:no helper  Pain Pain Assessment Pain Assessment: No/denies pain Pain Score:   6  Therapy/Group: Individual Therapy   Speech Language Pathology Discharge Summary  Patient Details  Name: Princess Karnes MRN: [id number] Date of Birth:  13-Jul-1955  Today's Date: 08/06/2012  Patient has met 4 of 4 long term goals.  Patient to discharge at overall Modified Independent;Supervision level.  Reasons goals not met: n/a   Clinical Impression/Discharge Summary: Patient made functional gains during CIR stay and met 4 out of 4 long term goals due to gains in anticipatory awareness, complex problem solving, recall of new information and attention to right side of body during functional tasks.  Patient discharges at an overall level of mod I with basic, familiar tasks and supervision with complex tasks.  During family education daughters reported her memory is close to baseline; as a result, education completed and no further follow up services are warranted at this time.    Care Partner:  Caregiver Able to Provide Assistance: Yes    Recommendation:  Other (comment) (intermittent supervision )     Equipment: none   Reasons for discharge: Treatment goals met;Discharged from hospital   Patient/Family Agrees with Progress Made and Goals Achieved: Yes   See FIM for current functional status  Charlane Ferretti., CCC-SLP [phone]  Johanny Segers 08/06/2012, 12:27 PM

## 2012-08-06 NOTE — Progress Notes (Signed)
Occupational Therapy Discharge Summary  Patient Details  Name: Sandy Davis MRN: [id number] Date of Birth: Oct 29, 1955  Today's Date: 08/06/2012  Patient has met 13 of 13 long term goals due to improved balance, postural control, ability to compensate for deficits, functional use of  RIGHT upper and RIGHT lower extremity and improved awareness.  Patient to discharge at overall Modified Independent level, pt will require occasional supervision/cues with higher level tasks (ie cooking) due to memory deficits. Patient's care partner is independent to provide the necessary cognitive assistance at discharge.    Reasons goals not met: N/A  Recommendation:  Patient will benefit from ongoing skilled OT services in outpatient setting to continue to advance functional skills in the area of BADL, iADL and Reduce care partner burden.  Equipment: shower chair  Reasons for discharge: treatment goals met and discharge from hospital  Patient/family agrees with progress made and goals achieved: Yes  OT Discharge Precautions/Restrictions  Precautions Precautions: Fall Precaution Comments: impaired sensation R side and h/o peripheral neuropathy and DM: needs to wear shoes OOB when walking General   Vital Signs Therapy Vitals Temp: 98.3 F (36.8 C) Temp src: Oral Pulse Rate: 68  Resp: 20  BP: 133/85 mmHg Patient Position, if appropriate: Sitting Oxygen Therapy SpO2: 96 % O2 Device: None (Room air) Pain Pain Assessment Pain Assessment: 0-10 Pain Score:   5 Pain Type: Neuropathic pain Pain Location: Arm (leg) Pain Orientation: Right Pain Radiating Towards: knee to foot Pain Descriptors: Tingling;Pins and needles;Numbness Pain Onset: On-going Pain Intervention(s): Medication (See eMAR) ADL ADL Grooming: Independent Where Assessed-Grooming: Standing at sink Upper Body Bathing: Modified independent Where Assessed-Upper Body Bathing: Shower Lower Body Bathing: Modified independent Where  Assessed-Lower Body Bathing: Shower Upper Body Dressing: Modified independent (Device) Where Assessed-Upper Body Dressing: Edge of bed Lower Body Dressing: Modified independent Where Assessed-Lower Body Dressing: Edge of bed Toileting: Modified independent Where Assessed-Toileting: Teacher, adult education: Engineer, agricultural Method: Proofreader: Raised toilet seat;Grab bars Film/video editor: Cytogeneticist Method: Manufacturing systems engineer with back ADL Comments: Pt reports decreased balance with ambulation secondary to tingling sensation all down Rt side of body Vision/Perception  Vision - History Baseline Vision: Wears glasses all the time Patient Visual Report: No change from baseline Vision - Assessment Eye Alignment: Within Functional Limits  Cognition  Impaired at baseline Sensation Sensation Light Touch: Impaired by gross assessment Light Touch Impaired Details: Impaired RUE;Impaired RLE Stereognosis: Not tested Hot/Cold: Not tested Proprioception: Appears Intact Proprioception Impaired Details: Impaired RUE;Impaired RLE Additional Comments: Reports intact to light touch but decreased; reports numbness and tingling R side; impaired proprioception (patient was not aware when her foot came out of the shoe) Coordination Gross Motor Movements are Fluid and Coordinated: No Fine Motor Movements are Fluid and Coordinated: No Coordination and Movement Description: delayed activation, timing/sequencing, mild ataxia  Finger Nose Finger Test: undershooting, might dysmetria when reaching distally Heel Shin Test: delayed activation, timing/sequencing, mild ataxia  Motor  Motor Motor: Hemiplegia;Ataxia;Motor apraxia;Motor impersistence Mobility  Bed Mobility Supine to Sit: 6: Modified independent (Device/Increase time) Sit to Supine: 6: Modified independent (Device/Increase time)   Trunk/Postural Assessment  Cervical Assessment Cervical Assessment: Within Functional Limits Thoracic Assessment Thoracic Assessment: Within Functional Limits Lumbar Assessment Lumbar Assessment: Within Functional Limits Postural Control Postural Control: Within Functional Limits  Balance Standardized Balance Assessment Standardized Balance Assessment: Berg Balance Test Berg Balance Test Sit to Stand: Able to stand without using hands and stabilize independently Standing  Unsupported: Able to stand safely 2 minutes Sitting with Back Unsupported but Feet Supported on Floor or Stool: Able to sit safely and securely 2 minutes Stand to Sit: Controls descent by using hands Transfers: Able to transfer safely, minor use of hands Standing Unsupported with Eyes Closed: Able to stand 10 seconds safely Standing Ubsupported with Feet Together: Able to place feet together independently and stand 1 minute safely From Standing, Reach Forward with Outstretched Arm: Can reach confidently >25 cm (10") From Standing Position, Pick up Object from Floor: Able to pick up shoe safely and easily From Standing Position, Turn to Look Behind Over each Shoulder: Looks behind from both sides and weight shifts well Turn 360 Degrees: Able to turn 360 degrees safely but slowly Standing Unsupported, Alternately Place Feet on Step/Stool: Able to stand independently and complete 8 steps >20 seconds Standing Unsupported, One Foot in Front: Able to plae foot ahead of the other independently and hold 30 seconds Standing on One Leg: Able to lift leg independently and hold 5-10 seconds Total Score: 50  Extremity/Trunk Assessment RUE Assessment RUE Assessment: Exceptions to WFL (AROM WFL, RUE strength 3+/5) LUE Assessment LUE Assessment: Within Functional Limits  See FIM for current functional status  Sunday Spillers 08/06/2012, 3:28 PM

## 2012-08-06 NOTE — Progress Notes (Signed)
Social Work Patient ID: Sandy Davis, female   DOB: 24-Apr-1956, 57 y.o.   MRN: [id number] Met with pt to discuss discharge plans and how she feels.  She reports she is comfortable with her discharge and feels she has made good progress while here. She feels ready to go home and is agreeable to OP therapies.  Still discussing DME needs.  Work toward discharge tomorrow.

## 2012-08-06 NOTE — Progress Notes (Signed)
Physical Therapy Discharge Summary  Patient Details  Name: Sandy Davis MRN: [id number] Date of Birth: 1956/06/25  Today's Date: 08/06/2012 Time:1300-1400 and 2725-3664 Time Calculation (min): 55 min and 60 min  First session: Patient participated in gait group with focus on bilat LE and UE endurance, strengthening and coordination training on Nustep at level 6 x 10 minutes at RPE of 13.  Patient also performed sit <> stand x 2 sets x 10 reps without the use of UE for increased concentric and eccentric control and hip and ankle strategy training.  Performed higher level gait and coordination training with R and L lateral stepping x 25' with supervision and no UE support.    Patient has met 8 of 9 long term goals due to improved activity tolerance, improved balance, decreased pain, ability to compensate for deficits, functional use of  right upper extremity and right lower extremity, improved awareness and improved coordination.  Patient to discharge at an ambulatory level Modified Independent with SPC.   Patient's care partner (daughter) did not formally attend a therapy session but patient is modified independent with transfers, gait, car transfers and stair negotiation and is able to verbalize sequence; daughter did attend SLP session and is independent to provide the necessary cognitive and supervision assistance at discharge.  Reasons goals not met: Patient did reach mod I balance goal but did not reach a BERG score of 53/56; patient's D/C BERG was 50/56 indicating moderate risk for falls.  Patient recommended to use [facility] for household and community ambulation to decrease risk for falls  Recommendation:  Patient will benefit from ongoing skilled PT services in outpatient setting to continue to advance safe functional mobility, address ongoing impairments in R sided strength, motor planning and control, timing/sequencing, coordination, ataxia, dynamic standing balance, gait, and minimize fall  risk.  Equipment: SPC  Reasons for discharge: treatment goals met and discharge from hospital  Patient/family agrees with progress made and goals achieved: Yes  PT Discharge Precautions/Restrictions Precautions Precautions: Fall Precaution Comments: impaired sensation R side and h/o peripheral neuropathy and DM: needs to wear shoes OOB when walking Vital Signs Therapy Vitals Temp: 98.3 F (36.8 C) Temp src: Oral Pulse Rate: 68  Resp: 20  BP: 133/85 mmHg Patient Position, if appropriate: Sitting Oxygen Therapy SpO2: 96 % O2 Device: None (Room air) Pain Pain Assessment Pain Assessment: 0-10 Pain Score:   5 Pain Type: Neuropathic pain Pain Location: Arm (leg) Pain Orientation: Right Pain Radiating Towards: knee to foot Pain Descriptors: Tingling;Pins and needles;Numbness Pain Onset: On-going Pain Intervention(s): Medication (See eMAR) Vision/Perception  Vision - History Baseline Vision: Wears glasses all the time Patient Visual Report: No change from baseline Vision - Assessment Eye Alignment: Within Functional Limits Praxis Praxis: Impaired Praxis Impairment Details: Motor planning  Sensation Sensation Light Touch: Impaired by gross assessment Light Touch Impaired Details: Impaired RUE;Impaired RLE Stereognosis: Not tested Hot/Cold: Not tested Proprioception: Appears Intact Proprioception Impaired Details: Impaired RUE;Impaired RLE Additional Comments: Reports intact to light touch but decreased; reports numbness and tingling R side; impaired proprioception (patient was not aware when her foot came out of the shoe) Coordination Gross Motor Movements are Fluid and Coordinated: No Fine Motor Movements are Fluid and Coordinated: No Coordination and Movement Description: delayed activation, timing/sequencing, mild ataxia  Finger Nose Finger Test: undershooting, might dysmetria when reaching distally Heel Shin Test: delayed activation, timing/sequencing, mild  ataxia  Motor  Motor Motor: Hemiplegia;Ataxia;Motor apraxia;Motor impersistence  Mobility Bed Mobility Supine to Sit: 6: Modified independent (Device/Increase time)  Sit to Supine: 6: Modified independent (Device/Increase time) Transfers Stand Pivot Transfers: 6: Modified independent (Device/Increase time) Patient also demonstrated mod I simulated car transfers to tall "van" and low "car" with SPC sitting first and then bringing LE into car and exiting car mod I.  Demonstrated to patient how to perform floor > furniture transfer and discussed indications for having family call EMS; patient gave repeat demonstration of prone > quadruped > tall kneeling > half kneeling > sit on couch with supervision and verbal cues for sequencing.  Locomotion  Ambulation Ambulation/Gait Assistance: 6: Modified independent (Device/Increase time) Ambulation Distance (Feet): 300 Feet Assistive device: Straight cane; still presents with ataxic gait Stairs / Additional Locomotion Stairs: Yes Stairs Assistance: 5: Supervision with verbal cues for sequence with SPC since it is a novel device Stair Management Technique: No rails;Step to pattern;With cane Number of Stairs: 12  Height of Stairs: 6.5  Wheelchair Mobility Wheelchair Mobility: No  Trunk/Postural Assessment  Cervical Assessment Cervical Assessment: Within Functional Limits Thoracic Assessment Thoracic Assessment: Within Functional Limits Lumbar Assessment Lumbar Assessment: Within Functional Limits Postural Control Postural Control: Within Functional Limits  Balance Standardized Balance Assessment Standardized Balance Assessment: Berg Balance Test Berg Balance Test Sit to Stand: Able to stand without using hands and stabilize independently Standing Unsupported: Able to stand safely 2 minutes Sitting with Back Unsupported but Feet Supported on Floor or Stool: Able to sit safely and securely 2 minutes Stand to Sit: Controls descent by using  hands Transfers: Able to transfer safely, minor use of hands Standing Unsupported with Eyes Closed: Able to stand 10 seconds safely Standing Ubsupported with Feet Together: Able to place feet together independently and stand 1 minute safely From Standing, Reach Forward with Outstretched Arm: Can reach confidently >25 cm (10") From Standing Position, Pick up Object from Floor: Able to pick up shoe safely and easily From Standing Position, Turn to Look Behind Over each Shoulder: Looks behind from both sides and weight shifts well Turn 360 Degrees: Able to turn 360 degrees safely but slowly Standing Unsupported, Alternately Place Feet on Step/Stool: Able to stand independently and complete 8 steps >20 seconds Standing Unsupported, One Foot in Front: Able to plae foot ahead of the other independently and hold 30 seconds Standing on One Leg: Able to lift leg independently and hold 5-10 seconds Total Score: 50  Patient demonstrates increased fall risk as noted by score of 50/56 on Berg Balance Scale.  (<36= high risk for falls, close to 100%; 37-45 significant >80%; 46-51 moderate >50%; 52-55 lower >25%); discussed with patient her falls risk and use of SPC in home and community to minimize falls risk.  Extremity Assessment  RUE Assessment RUE Assessment: Exceptions to WFL (AROM WFL, RUE strength 3+/5) LUE Assessment LUE Assessment: Within Functional Limits RLE Strength RLE Overall Strength: Deficits RLE Overall Strength Comments: impaired timing, sequencing and coordination, delayed activation; 3/5 hip flexion, 4-/5 knee flexion, extension, ankle DF; ataxic movements LLE Assessment LLE Assessment: Within Functional Limits  See FIM for current functional status  [facility] 08/06/2012, 4:59 PM

## 2012-08-07 LAB — GLUCOSE, CAPILLARY
Glucose-Capillary: 142 mg/dL — ABNORMAL HIGH (ref 70–99)
Glucose-Capillary: 53 mg/dL — ABNORMAL LOW (ref 70–99)
Glucose-Capillary: 74 mg/dL (ref 70–99)
Glucose-Capillary: 147 mg/dL — ABNORMAL HIGH (ref 70–99)
Glucose-Capillary: 71 mg/dL (ref 70–99)
Glucose-Capillary: 120 mg/dL — ABNORMAL HIGH (ref 70–99)
Glucose-Capillary: 63 mg/dL — ABNORMAL LOW (ref 70–99)
Glucose-Capillary: 88 mg/dL (ref 70–99)

## 2012-08-07 MED ORDER — TRAMADOL HCL 50 MG PO TABS: 50.0000 mg | ORAL_TABLET | Freq: Four times a day (QID) | ORAL | Status: DC | PRN

## 2012-08-07 MED ORDER — INSULIN DETEMIR 100 UNIT/ML ~~LOC~~ SOLN: 18.0000 [IU] | Freq: Two times a day (BID) | SUBCUTANEOUS | Status: DC

## 2012-08-07 MED ORDER — POLYETHYLENE GLYCOL 3350 17 G PO PACK: 17.0000 g | PACK | Freq: Two times a day (BID) | ORAL | Status: DC

## 2012-08-07 MED ORDER — GABAPENTIN 400 MG PO CAPS: 400.0000 mg | ORAL_CAPSULE | Freq: Three times a day (TID) | ORAL | Status: DC

## 2012-08-07 NOTE — Progress Notes (Signed)
Pt discharged at 1115 with daughter to home. Belongings with pt. Discharge instructions given by Marissa Nestle, PA to pt and daughter with no further questions at this time. Nursing reviewed diabetic medications with verbal understanding.

## 2012-08-07 NOTE — Progress Notes (Signed)
Social Work Discharge Note Discharge Note  The overall goal for the admission was met for:   Discharge location: Yes-HOME WITH DAUGHTER, WHO IS IN/OUT THERE IN THE EVENINGS  Length of Stay: Yes-7 DAYS  Discharge activity level: Yes-MOD/I LEVEL  Home/community participation: Yes  Services provided included: MD, RD, PT, OT, SLP, RN, TR, Pharmacy and SW  Financial Services: Private Insurance: [employer]-MEDICARE & MEDICAID  Follow-up services arranged: Outpatient: [facility] 10;00-11;15 AM PT, OT and DME: [facility]  Comments (or additional information):  Patient/Family verbalized understanding of follow-up arrangements: Yes  Individual responsible for coordination of the follow-up plan: SELF & NICOLE-DAUGHTER  Confirmed correct DME delivered: Lucy Chris 08/07/2012    Lucy Chris

## 2012-08-07 NOTE — Significant Event (Signed)
Hypoglycemic Event  CBG: 66  Treatment: 15 GM carbohydrate snack  Symptoms: None  Follow-up CBG: Time:0745 CBG Result:88  Possible Reasons for Event: Inadequate meal intake  Comments/MD notified: Snack given with breakfast on the way.     Sandy Davis  Remember to initiate Hypoglycemia Order Set & complete

## 2012-08-07 NOTE — Progress Notes (Signed)
Patient ID: Sandy Davis, female   DOB: 1955-11-03, 57 y.o.   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  Discussed Thalamic pain syndrome, improvements overall on gabapentin A 12 point review of systems has been performed and if not noted above is otherwise negative.  Review of Systems  Neurological: Positive for tingling, sensory change and focal weakness. Negative for dizziness and seizures.  All other systems reviewed and are negative.   Objective: Vital Signs: Blood pressure 136/87, pulse 75, temperature 98.1 F (36.7 C), temperature source Oral, resp. rate 18, height 5\' 7"  (1.702 m), weight 98.3 kg (216 lb 11.4 oz), SpO2 97.00%. No results found. No results found for this basename: WBC:2,HGB:2,HCT:2,PLT:2 in the last 72 hours  Basename 08/06/12 0941  NA 143  K 4.5  CL 106  GLUCOSE 127*  BUN 14  CREATININE 0.97  CALCIUM 10.6*   CBG (last 3)   Basename 08/07/12 0745 08/07/12 0722 08/06/12 2055  GLUCAP 88 63* 143*    Wt Readings from Last 3 Encounters:  07/31/12 98.3 kg (216 lb 11.4 oz)  07/30/12 98.657 kg (217 lb 8 oz)  07/30/12 98.657 kg (217 lb 8 oz)    Physical Exam:  Constitutional: She is oriented to person, place, and time. She appears well-developed and well-nourished.  HENT:  Head: Normocephalic and atraumatic.  Eyes: Pupils are equal, round, and reactive to light.  Neck: Normal range of motion.  Cardiovascular: Normal rate and regular rhythm. No murmur  Pulmonary/Chest: Effort normal and breath sounds normal.  Abdominal: Soft. Bowel sounds are normal.  Musculoskeletal: She exhibits no edema and no tenderness.  Neurological: She is alert and oriented to person, place, and time.  Follows full commands. Right central 7, minimal tongue deviation. Decreased LT over right  face, arm, leg. RUE is 3 to 3+ at shouder, biceps, triceps, HI are 3-/5 with impaired FMC, ataxic. Right pronator drift. LE is 3/5 with impaired coordination. Good sitting balance. Speech is intelligible. Cognitively intact.  Sensation reduced on Right side face, arm and leg. Skin: Skin is warm and dry.  Dry annular lesions on bilateral hands.  Psychiatric: She has a normal mood and affect. Her speech is normal and behavior is normal. Thought content normal. Cognition and memory are normal.    Assessment/Plan: 1. Functional deficits secondary to embolic infarct involving the left cerebellum, inferior left occipital lobe and lateral thalamus  Stable for D/C today F/u PCP in 1-2 weeks F/u PM&R 3 weeks See D/C summary See D/C instructions    FIM: FIM - Bathing Bathing Steps Patient Completed: Chest;Right Arm;Left Arm;Abdomen;Front perineal area;Buttocks;Right upper leg;Left upper leg;Right lower leg (including foot);Left lower leg (including foot) Bathing: 6: More than reasonable amount of time  FIM - Upper Body Dressing/Undressing Upper body dressing/undressing steps patient completed: Thread/unthread right bra strap;Thread/unthread left bra strap;Hook/unhook bra;Thread/unthread right sleeve of pullover shirt/dresss;Thread/unthread left sleeve of pullover shirt/dress;Put head through opening of pull over shirt/dress;Pull shirt over trunk Upper body dressing/undressing: 6: More than reasonable amount of time FIM - Lower Body Dressing/Undressing Lower body dressing/undressing steps patient completed: Thread/unthread right underwear leg;Thread/unthread left underwear leg;Pull underwear up/down;Thread/unthread right pants leg;Don/Doff left sock;Don/Doff right sock;Don/Doff left shoe;Pull pants up/down;Fasten/unfasten pants;Thread/unthread left pants leg;Don/Doff right shoe Lower body dressing/undressing: 6: More than reasonable amount of time  FIM - Toileting Toileting steps completed by patient:  Adjust clothing  prior to toileting;Performs perineal hygiene;Adjust clothing after toileting Toileting Assistive Devices: Grab bar or rail for support Toileting: 0: Activity did not occur  FIM - Diplomatic Services operational officer Devices: Grab bars Toilet Transfers: 0-Activity did not occur  FIM - Banker Devices: Arm rests Bed/Chair Transfer: 6: Supine > Sit: No assist;6: Sit > Supine: No assist;6: Bed > Chair or W/C: No assist;6: Chair or W/C > Bed: No assist  FIM - Locomotion: Wheelchair Locomotion: Wheelchair: 0: Activity did not occur FIM - Locomotion: Ambulation Locomotion: Ambulation Assistive Devices: Emergency planning/management officer Ambulation/Gait Assistance: 6: Modified independent (Device/Increase time) Locomotion: Ambulation: 6: Travels 150 ft or more with assistive device/no helper  Comprehension Comprehension Mode: Auditory Comprehension: 6-Follows complex conversation/direction: With extra time/assistive device  Expression Expression Mode: Verbal Expression: 6-Expresses complex ideas: With extra time/assistive device  Social Interaction Social Interaction: 6-Interacts appropriately with others with medication or extra time (anti-anxiety, antidepressant).  Problem Solving Problem Solving: 5-Solves complex 90% of the time/cues < 10% of the time  Memory Memory: 5-Recognizes or recalls 90% of the time/requires cueing < 10% of the time  Medical Problem List and Plan:  1. DVT Prophylaxis/Anticoagulation: Pharmaceutical: Lovenox  2. Pain Management: add ultram and neurontin  titrate upward for neuropathic pain 3. Mood: motivated to get better and home. Continue to provide ego support about progress. LCSW to follow for formal evaluation.  4. Neuropsych: This patient is capable of making decisions on his/her own behalf.  5. HTN: monitor with bid checks. Continue Norvasc. Monitor for need of additional agent.  6. DM type 2: monitor with  ac/hs. CBG checks. Continue levemir bid with SSI for elevated BS. Metformin on hold due to dye study.  7. Dyslipidemia: continue Lipitor daily.  8. Thyroid nodules: follow up on outpatient basis.  9. Microscopic polyangiitis: No meds X 3 years.   LOS (Days) 7 A FACE TO FACE EVALUATION WAS PERFORMED  Erick Colace 08/07/2012 8:13 AM

## 2012-08-08 LAB — GLUCOSE, CAPILLARY
Glucose-Capillary: 58 mg/dL — ABNORMAL LOW (ref 70–99)
Glucose-Capillary: 95 mg/dL (ref 70–99)
Glucose-Capillary: 72 mg/dL (ref 70–99)
Glucose-Capillary: 98 mg/dL (ref 70–99)
Glucose-Capillary: 106 mg/dL — ABNORMAL HIGH (ref 70–99)

## 2012-08-09 ENCOUNTER — Ambulatory Visit: Payer: PRIVATE HEALTH INSURANCE | Attending: Physical Medicine & Rehabilitation | Admitting: Physical Therapy

## 2012-08-09 DIAGNOSIS — R269 Unspecified abnormalities of gait and mobility: Secondary | ICD-10-CM | POA: Insufficient documentation

## 2012-08-09 DIAGNOSIS — I69919 Unspecified symptoms and signs involving cognitive functions following unspecified cerebrovascular disease: Secondary | ICD-10-CM | POA: Insufficient documentation

## 2012-08-09 DIAGNOSIS — R279 Unspecified lack of coordination: Secondary | ICD-10-CM | POA: Insufficient documentation

## 2012-08-09 DIAGNOSIS — Z5189 Encounter for other specified aftercare: Secondary | ICD-10-CM | POA: Insufficient documentation

## 2012-08-09 DIAGNOSIS — I69998 Other sequelae following unspecified cerebrovascular disease: Secondary | ICD-10-CM | POA: Insufficient documentation

## 2012-08-09 DIAGNOSIS — M6281 Muscle weakness (generalized): Secondary | ICD-10-CM | POA: Insufficient documentation

## 2012-08-10 NOTE — Progress Notes (Signed)
Discharge summary # [phone]

## 2012-08-11 NOTE — Discharge Summary (Signed)
NAMEMarland Kitchen  Sandy Davis NO.:  [id number]  MEDICAL RECORD NO.:  [id number]  LOCATION:  4142                         FACILITY:  [facility]  PHYSICIAN:  Erick Colace, M.D. DATE OF BIRTH:  Apr 01, 1956  DATE OF ADMISSION:  07/31/2012 DATE OF DISCHARGE:  08/07/2012                              DISCHARGE SUMMARY   DISCHARGE DIAGNOSES: 1. Embolic cerebrovascular accident affecting left cerebellum and inferior left occipital lobe and lateral thalamus. 2. Hypertension. 3. Diabetes mellitus type 2. 4. Microscopic cholangitis. 5. Thyroid nodules.  HISTORY OF PRESENT ILLNESS:  [name] is a 57 year old female with history of hypertension, diabetes mellitus, microscopic cholangitis, who was admitted on July 28, 2012, with slurred speech and right-sided numbness and weakness.  MRI of brain showed scattered small acute infarct in left cerebellum inferior left occipital lobe and lateral thalamus near posterior limb of internal capsule.  Carotid Doppler showed no ICA stenosis.  An incidental note of solid right thyroid mass noted.  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  TEE was done and showed no thrombus, PFO, ASD, and normal LVH. Hypercoagulable panel was negative for lupus anticoagulants and negative for factor V Leiden.   Thyroid ultrasound done showed bilateral thyroid masses. Thyroid studies showed low borderline low T3 with normal TSH with recommendation for biopsy for work up. Neurology felt that the patient with embolic CVA due to her history of periarteritis nodosa and the patient was changed to Plavix for CVA prophylaxis. They also recommended follow up cardiac monitoring on outpatient basis to rule out cardiac arrythmia.   FUNCTIONAL HISTORY: Patient was independent for mobility and self care prior to admission. She has custody of her two small  grandchildren. She was able to drive    FUNCTIONAL STATUS: Patient needed min-guard assistance for transfers and ambulation. She was able to ambulate 200 feet with narrow base of support and cues for body positioning in RW. She required minimal assist for grooming and toilet transfers. Memory, complex problem solving and attention were impaired.   HOSPITAL COURSE:  [name] was admitted to Rehab on July 31, 2012, for inpatient therapies to consist of PT/PT and OT and speech therapy at least 3 hours 5 days a week.  Past admission, physiatrist, rehab, RN, and therapy team have worked together to provide customized collaborative interdisciplinary care.  Rehab RN has worked with the patient on bowel and bladder program as well as skin care monitoring. The patient's blood pressures were monitored on b.i.d. basis during this stay and these have been reasonably controlled overall.  The patient has been continent of bowel and bladder.  She was started on gabapentin to help with her neuropathic symptoms of tingling and numbness.  Diabetes was monitored on a.c. and nightly basis.  She was noted to have hypoglycemic episodes therefore her Levemir dose was decreased and metformin was discontinued.  Blood sugars at time of discharge are ranging from 60s to 140s range.  Routine labs done during this stay, CBC done revealed a H and H at 11.5 and 35.4, white count 7.3, platelets  283.  Check of lytes revealed sodium 143, potassium 4.5, chloride 106, CO2 of 30, BUN 14, creatinine 0.97, glucose 127.  During the patient's stay in rehab, weekly team conferences were held to monitor the patient's progress, set goals, as well as discuss barriers to discharge.  Speech therapy has worked with patient on word-finding strategies as well as improving ability to solve complex problems.  By the time of discharge, the patient was able to express and comprehend complex information and ideas with extra time.  The patient was  at modified independent overall with basic and familiar tasks.  She requires supervision with complex tasks.  Family education was done with daughters who reported that her memory was close to baseline.  Therefore no further speech therapy needed past discharge.   OT has worked with patient on self-care tasks as well as neuro re-education of right upper and lower extremity.  The patient is showing improvement in awareness of her right side.  She is modified independent for bathing and dressing tasks.  She will require occasional supervision with cues for high-level tasks due to memory deficits.  PT has worked with patient on mobility strengthening as well as coordination.  The patient is showing improvement in functional use as well as coordiantion of right upper and right lower extremity.  She is modified independent for ambulating with a straight point cane.  She is modified independent for car transfers, stair navigation, and is able to verbalize sequencing of this with family as needed.   Her BERG score at time of discharge is at 50/56 and it is recommended that she use straight point cane for house well as well as community ambulation to decrease fall risk.  Further followup outpatient therapies have been set up to continue past discharge.  On July 07, 2012, the patient is discharged to home in improved condition.  DISCHARGE MEDICATIONS:  Gabapentin 400 mg p.o. q.i.d., MiraLax 17 g in 8 ounces p.o. b.i.d., tramadol 50 mg p.o. q.i.d. p.r.n. pain, Levemir 18 units subcu b.i.d., Tylenol 650 mg p.o. q.6 hours p.r.n. pain, coated aspirin 325 mg p.o. per day, Norvasc 10 mg p.o. per day.  DIET:  Carb modified medium.  ACTIVITY LEVEL:  As tolerated with intermittent supervision. Use cane for safe mobility.  SPECIAL INSTRUCTIONS:  Do not use metformin.  Note decreased dose of Levemir. Check blood sugars 2-3 times a day before meals.  Continue home dose of simvastatin.  FOLLOWUP:  The patient to follow up  with Dr. Claudette Laws on August 21, 2012, at 11 a.m. for 11:30 appointment.  Follow up with Dr. Delia Heady in 6 to 8 weeks.  Follow up with Dr. Parke Simmers on August 13, 2012,  with further discussion for workup of thyroid nodules as well as for setup of outpatient cardiac telemetry monitoring to assess for cardiac arrhythmia.     Delle Reining, P.A.   ______________________________ Erick Colace, M.D.    PL/MEDQ  D:  08/10/2012  T:  08/10/2012  Job:  [id number]  cc:   Renaye Rakers, M.D. Pramod P. Pearlean Brownie, MD

## 2012-08-14 ENCOUNTER — Ambulatory Visit: Payer: PRIVATE HEALTH INSURANCE | Admitting: Physical Therapy

## 2012-08-15 ENCOUNTER — Ambulatory Visit: Payer: PRIVATE HEALTH INSURANCE | Admitting: Occupational Therapy

## 2012-08-16 ENCOUNTER — Ambulatory Visit: Payer: PRIVATE HEALTH INSURANCE | Admitting: Physical Therapy

## 2012-08-20 ENCOUNTER — Ambulatory Visit: Payer: PRIVATE HEALTH INSURANCE | Admitting: Physical Therapy

## 2012-08-21 ENCOUNTER — Encounter: Payer: PRIVATE HEALTH INSURANCE | Attending: Physical Medicine & Rehabilitation

## 2012-08-21 ENCOUNTER — Ambulatory Visit (HOSPITAL_BASED_OUTPATIENT_CLINIC_OR_DEPARTMENT_OTHER): Payer: PRIVATE HEALTH INSURANCE | Admitting: Physical Medicine & Rehabilitation

## 2012-08-21 ENCOUNTER — Encounter: Payer: Self-pay | Admitting: Physical Medicine & Rehabilitation

## 2012-08-21 VITALS — BP 113/75 | HR 75 | Resp 14 | Ht 67.0 in | Wt 208.0 lb

## 2012-08-21 DIAGNOSIS — I69993 Ataxia following unspecified cerebrovascular disease: Secondary | ICD-10-CM

## 2012-08-21 DIAGNOSIS — R262 Difficulty in walking, not elsewhere classified: Secondary | ICD-10-CM | POA: Insufficient documentation

## 2012-08-21 DIAGNOSIS — F329 Major depressive disorder, single episode, unspecified: Secondary | ICD-10-CM | POA: Insufficient documentation

## 2012-08-21 DIAGNOSIS — R5381 Other malaise: Secondary | ICD-10-CM | POA: Insufficient documentation

## 2012-08-21 DIAGNOSIS — F3289 Other specified depressive episodes: Secondary | ICD-10-CM | POA: Insufficient documentation

## 2012-08-21 DIAGNOSIS — R5383 Other fatigue: Secondary | ICD-10-CM | POA: Insufficient documentation

## 2012-08-21 DIAGNOSIS — R209 Unspecified disturbances of skin sensation: Secondary | ICD-10-CM | POA: Insufficient documentation

## 2012-08-21 DIAGNOSIS — R42 Dizziness and giddiness: Secondary | ICD-10-CM | POA: Insufficient documentation

## 2012-08-21 DIAGNOSIS — E042 Nontoxic multinodular goiter: Secondary | ICD-10-CM | POA: Insufficient documentation

## 2012-08-21 DIAGNOSIS — R269 Unspecified abnormalities of gait and mobility: Secondary | ICD-10-CM | POA: Insufficient documentation

## 2012-08-21 DIAGNOSIS — I6340 Cerebral infarction due to embolism of unspecified cerebral artery: Secondary | ICD-10-CM | POA: Insufficient documentation

## 2012-08-21 DIAGNOSIS — I69998 Other sequelae following unspecified cerebrovascular disease: Secondary | ICD-10-CM

## 2012-08-21 DIAGNOSIS — I10 Essential (primary) hypertension: Secondary | ICD-10-CM | POA: Insufficient documentation

## 2012-08-21 DIAGNOSIS — M300 Polyarteritis nodosa: Secondary | ICD-10-CM | POA: Insufficient documentation

## 2012-08-21 DIAGNOSIS — E119 Type 2 diabetes mellitus without complications: Secondary | ICD-10-CM | POA: Insufficient documentation

## 2012-08-21 NOTE — Patient Instructions (Signed)
No driving Return in one month Continue outpatient therapy
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
## 2012-08-23 ENCOUNTER — Ambulatory Visit: Payer: PRIVATE HEALTH INSURANCE | Admitting: Physical Therapy

## 2012-08-29 ENCOUNTER — Ambulatory Visit: Payer: PRIVATE HEALTH INSURANCE | Admitting: Physical Therapy

## 2012-08-29 ENCOUNTER — Ambulatory Visit: Payer: PRIVATE HEALTH INSURANCE | Admitting: Occupational Therapy

## 2012-08-31 ENCOUNTER — Ambulatory Visit: Payer: PRIVATE HEALTH INSURANCE | Admitting: Occupational Therapy

## 2012-08-31 ENCOUNTER — Ambulatory Visit: Payer: PRIVATE HEALTH INSURANCE | Admitting: Physical Therapy

## 2012-09-03 ENCOUNTER — Ambulatory Visit: Payer: PRIVATE HEALTH INSURANCE | Admitting: Occupational Therapy

## 2012-09-03 ENCOUNTER — Ambulatory Visit: Payer: PRIVATE HEALTH INSURANCE | Attending: Physical Medicine & Rehabilitation | Admitting: Physical Therapy

## 2012-09-03 DIAGNOSIS — R279 Unspecified lack of coordination: Secondary | ICD-10-CM | POA: Insufficient documentation

## 2012-09-03 DIAGNOSIS — R269 Unspecified abnormalities of gait and mobility: Secondary | ICD-10-CM | POA: Insufficient documentation

## 2012-09-03 DIAGNOSIS — M6281 Muscle weakness (generalized): Secondary | ICD-10-CM | POA: Insufficient documentation

## 2012-09-03 DIAGNOSIS — Z5189 Encounter for other specified aftercare: Secondary | ICD-10-CM | POA: Insufficient documentation

## 2012-09-03 DIAGNOSIS — I69919 Unspecified symptoms and signs involving cognitive functions following unspecified cerebrovascular disease: Secondary | ICD-10-CM | POA: Insufficient documentation

## 2012-09-03 DIAGNOSIS — I69998 Other sequelae following unspecified cerebrovascular disease: Secondary | ICD-10-CM | POA: Insufficient documentation

## 2012-09-05 ENCOUNTER — Ambulatory Visit: Payer: PRIVATE HEALTH INSURANCE | Admitting: Physical Therapy

## 2012-09-05 ENCOUNTER — Ambulatory Visit: Payer: PRIVATE HEALTH INSURANCE | Admitting: Occupational Therapy

## 2012-09-10 ENCOUNTER — Ambulatory Visit: Payer: PRIVATE HEALTH INSURANCE | Admitting: Physical Therapy

## 2012-09-10 ENCOUNTER — Other Ambulatory Visit: Payer: Self-pay

## 2012-09-10 ENCOUNTER — Ambulatory Visit: Payer: PRIVATE HEALTH INSURANCE | Admitting: Occupational Therapy

## 2012-09-10 MED ORDER — TRAMADOL HCL 50 MG PO TABS: 50.0000 mg | ORAL_TABLET | Freq: Four times a day (QID) | ORAL | Status: DC | PRN

## 2012-09-12 ENCOUNTER — Ambulatory Visit: Payer: PRIVATE HEALTH INSURANCE | Admitting: Occupational Therapy

## 2012-09-12 ENCOUNTER — Ambulatory Visit: Payer: PRIVATE HEALTH INSURANCE | Admitting: Physical Therapy

## 2012-09-17 ENCOUNTER — Encounter: Payer: Self-pay | Admitting: Physical Medicine & Rehabilitation

## 2012-09-17 ENCOUNTER — Ambulatory Visit (HOSPITAL_BASED_OUTPATIENT_CLINIC_OR_DEPARTMENT_OTHER): Payer: PRIVATE HEALTH INSURANCE | Admitting: Physical Medicine & Rehabilitation

## 2012-09-17 ENCOUNTER — Encounter: Payer: PRIVATE HEALTH INSURANCE | Attending: Physical Medicine & Rehabilitation

## 2012-09-17 VITALS — BP 131/87 | HR 60 | Resp 14 | Ht 67.0 in | Wt 212.0 lb

## 2012-09-17 DIAGNOSIS — R209 Unspecified disturbances of skin sensation: Secondary | ICD-10-CM | POA: Insufficient documentation

## 2012-09-17 DIAGNOSIS — R42 Dizziness and giddiness: Secondary | ICD-10-CM | POA: Insufficient documentation

## 2012-09-17 DIAGNOSIS — I6340 Cerebral infarction due to embolism of unspecified cerebral artery: Secondary | ICD-10-CM | POA: Insufficient documentation

## 2012-09-17 DIAGNOSIS — E042 Nontoxic multinodular goiter: Secondary | ICD-10-CM | POA: Insufficient documentation

## 2012-09-17 DIAGNOSIS — M300 Polyarteritis nodosa: Secondary | ICD-10-CM | POA: Insufficient documentation

## 2012-09-17 DIAGNOSIS — I10 Essential (primary) hypertension: Secondary | ICD-10-CM | POA: Insufficient documentation

## 2012-09-17 DIAGNOSIS — E119 Type 2 diabetes mellitus without complications: Secondary | ICD-10-CM | POA: Insufficient documentation

## 2012-09-17 DIAGNOSIS — F3289 Other specified depressive episodes: Secondary | ICD-10-CM | POA: Insufficient documentation

## 2012-09-17 DIAGNOSIS — R269 Unspecified abnormalities of gait and mobility: Secondary | ICD-10-CM | POA: Insufficient documentation

## 2012-09-17 DIAGNOSIS — R262 Difficulty in walking, not elsewhere classified: Secondary | ICD-10-CM | POA: Insufficient documentation

## 2012-09-17 DIAGNOSIS — R5381 Other malaise: Secondary | ICD-10-CM | POA: Insufficient documentation

## 2012-09-17 DIAGNOSIS — F329 Major depressive disorder, single episode, unspecified: Secondary | ICD-10-CM | POA: Insufficient documentation

## 2012-09-17 MED ORDER — GABAPENTIN 400 MG PO CAPS: 400.0000 mg | ORAL_CAPSULE | Freq: Three times a day (TID) | ORAL | Status: DC
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
## 2012-09-17 NOTE — Patient Instructions (Addendum)
Continue physical therapy Written prescription to get the brace for right ankle from [facility] Continue gabapentin. New prescription has been supplied Continue tramadol you have 2 refills on the See me in one month We will discuss driving in one month, no driving until next visit

## 2012-09-18 ENCOUNTER — Ambulatory Visit: Payer: PRIVATE HEALTH INSURANCE | Admitting: Physical Therapy

## 2012-09-18 ENCOUNTER — Ambulatory Visit: Payer: PRIVATE HEALTH INSURANCE | Admitting: Occupational Therapy

## 2012-09-20 ENCOUNTER — Ambulatory Visit: Payer: PRIVATE HEALTH INSURANCE | Admitting: Physical Therapy

## 2012-09-20 ENCOUNTER — Ambulatory Visit: Payer: PRIVATE HEALTH INSURANCE | Admitting: Occupational Therapy

## 2012-09-24 ENCOUNTER — Ambulatory Visit: Payer: PRIVATE HEALTH INSURANCE | Admitting: Physical Therapy

## 2012-09-27 ENCOUNTER — Ambulatory Visit: Payer: PRIVATE HEALTH INSURANCE | Admitting: Physical Therapy

## 2012-10-02 ENCOUNTER — Ambulatory Visit: Payer: PRIVATE HEALTH INSURANCE | Attending: Physical Medicine & Rehabilitation | Admitting: Physical Therapy

## 2012-10-02 ENCOUNTER — Ambulatory Visit: Payer: PRIVATE HEALTH INSURANCE | Admitting: Occupational Therapy

## 2012-10-02 DIAGNOSIS — Z5189 Encounter for other specified aftercare: Secondary | ICD-10-CM | POA: Insufficient documentation

## 2012-10-02 DIAGNOSIS — I69998 Other sequelae following unspecified cerebrovascular disease: Secondary | ICD-10-CM | POA: Diagnosis not present

## 2012-10-02 DIAGNOSIS — R279 Unspecified lack of coordination: Secondary | ICD-10-CM | POA: Insufficient documentation

## 2012-10-02 DIAGNOSIS — I69919 Unspecified symptoms and signs involving cognitive functions following unspecified cerebrovascular disease: Secondary | ICD-10-CM | POA: Insufficient documentation

## 2012-10-02 DIAGNOSIS — R269 Unspecified abnormalities of gait and mobility: Secondary | ICD-10-CM | POA: Diagnosis not present

## 2012-10-02 DIAGNOSIS — M6281 Muscle weakness (generalized): Secondary | ICD-10-CM | POA: Insufficient documentation

## 2012-10-04 ENCOUNTER — Ambulatory Visit: Payer: PRIVATE HEALTH INSURANCE | Admitting: Physical Therapy

## 2012-10-04 ENCOUNTER — Ambulatory Visit: Payer: PRIVATE HEALTH INSURANCE | Admitting: *Deleted

## 2012-10-04 DIAGNOSIS — Z5189 Encounter for other specified aftercare: Secondary | ICD-10-CM | POA: Diagnosis not present

## 2012-10-09 ENCOUNTER — Ambulatory Visit: Payer: PRIVATE HEALTH INSURANCE | Admitting: Occupational Therapy

## 2012-10-09 ENCOUNTER — Ambulatory Visit: Payer: PRIVATE HEALTH INSURANCE | Admitting: Physical Therapy

## 2012-10-09 DIAGNOSIS — Z5189 Encounter for other specified aftercare: Secondary | ICD-10-CM | POA: Diagnosis not present

## 2012-10-11 ENCOUNTER — Encounter: Payer: PRIVATE HEALTH INSURANCE | Admitting: Occupational Therapy

## 2012-10-11 ENCOUNTER — Ambulatory Visit: Payer: PRIVATE HEALTH INSURANCE | Admitting: Physical Therapy

## 2012-10-15 ENCOUNTER — Encounter: Payer: PRIVATE HEALTH INSURANCE | Attending: Physical Medicine & Rehabilitation

## 2012-10-15 ENCOUNTER — Ambulatory Visit: Payer: PRIVATE HEALTH INSURANCE | Admitting: Physical Medicine & Rehabilitation

## 2012-10-31 ENCOUNTER — Other Ambulatory Visit: Payer: Self-pay | Admitting: Family Medicine

## 2012-10-31 DIAGNOSIS — R7989 Other specified abnormal findings of blood chemistry: Secondary | ICD-10-CM

## 2012-11-02 ENCOUNTER — Ambulatory Visit
Admission: RE | Admit: 2012-11-02 | Discharge: 2012-11-02 | Disposition: A | Discharge status: Home / Self Care | Payer: PRIVATE HEALTH INSURANCE | Source: Ambulatory Visit | Attending: Family Medicine | Admitting: Family Medicine

## 2012-11-02 DIAGNOSIS — R7989 Other specified abnormal findings of blood chemistry: Secondary | ICD-10-CM

## 2012-11-06 ENCOUNTER — Encounter: Payer: PRIVATE HEALTH INSURANCE | Attending: Physical Medicine & Rehabilitation

## 2012-11-06 ENCOUNTER — Encounter: Payer: Self-pay | Admitting: Physical Medicine & Rehabilitation

## 2012-11-06 ENCOUNTER — Ambulatory Visit (HOSPITAL_BASED_OUTPATIENT_CLINIC_OR_DEPARTMENT_OTHER): Payer: PRIVATE HEALTH INSURANCE | Admitting: Physical Medicine & Rehabilitation

## 2012-11-06 VITALS — BP 148/83 | HR 63 | Resp 14 | Ht 67.0 in | Wt 211.8 lb

## 2012-11-06 DIAGNOSIS — R5383 Other fatigue: Secondary | ICD-10-CM | POA: Insufficient documentation

## 2012-11-06 DIAGNOSIS — E042 Nontoxic multinodular goiter: Secondary | ICD-10-CM | POA: Insufficient documentation

## 2012-11-06 DIAGNOSIS — R269 Unspecified abnormalities of gait and mobility: Secondary | ICD-10-CM | POA: Insufficient documentation

## 2012-11-06 DIAGNOSIS — I10 Essential (primary) hypertension: Secondary | ICD-10-CM | POA: Insufficient documentation

## 2012-11-06 DIAGNOSIS — R42 Dizziness and giddiness: Secondary | ICD-10-CM | POA: Insufficient documentation

## 2012-11-06 DIAGNOSIS — I69998 Other sequelae following unspecified cerebrovascular disease: Secondary | ICD-10-CM

## 2012-11-06 DIAGNOSIS — R209 Unspecified disturbances of skin sensation: Secondary | ICD-10-CM

## 2012-11-06 DIAGNOSIS — E119 Type 2 diabetes mellitus without complications: Secondary | ICD-10-CM | POA: Insufficient documentation

## 2012-11-06 DIAGNOSIS — F3289 Other specified depressive episodes: Secondary | ICD-10-CM | POA: Insufficient documentation

## 2012-11-06 DIAGNOSIS — R5381 Other malaise: Secondary | ICD-10-CM | POA: Insufficient documentation

## 2012-11-06 DIAGNOSIS — R262 Difficulty in walking, not elsewhere classified: Secondary | ICD-10-CM | POA: Insufficient documentation

## 2012-11-06 DIAGNOSIS — M300 Polyarteritis nodosa: Secondary | ICD-10-CM | POA: Insufficient documentation

## 2012-11-06 DIAGNOSIS — F329 Major depressive disorder, single episode, unspecified: Secondary | ICD-10-CM | POA: Insufficient documentation

## 2012-11-06 DIAGNOSIS — I6340 Cerebral infarction due to embolism of unspecified cerebral artery: Secondary | ICD-10-CM | POA: Insufficient documentation

## 2012-11-06 MED ORDER — GABAPENTIN 400 MG PO CAPS: 400.0000 mg | ORAL_CAPSULE | Freq: Three times a day (TID) | ORAL | Status: DC
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
## 2012-11-06 NOTE — Patient Instructions (Signed)
Followup with your primary care doctor as well as her neurologist. You'll see me on an as needed basis

## 2012-12-01 ENCOUNTER — Encounter: Payer: Self-pay | Admitting: Cardiology

## 2012-12-01 DIAGNOSIS — I719 Aortic aneurysm of unspecified site, without rupture: Secondary | ICD-10-CM

## 2012-12-01 HISTORY — DX: Aortic aneurysm of unspecified site, without rupture: I71.9

## 2012-12-06 ENCOUNTER — Other Ambulatory Visit: Payer: Self-pay | Admitting: *Deleted

## 2012-12-06 ENCOUNTER — Ambulatory Visit (INDEPENDENT_AMBULATORY_CARE_PROVIDER_SITE_OTHER): Payer: Medicare Other | Admitting: Neurology

## 2012-12-06 ENCOUNTER — Encounter: Payer: Self-pay | Admitting: Neurology

## 2012-12-06 VITALS — BP 134/74 | HR 67 | Ht 67.0 in | Wt 212.0 lb

## 2012-12-06 DIAGNOSIS — I6350 Cerebral infarction due to unspecified occlusion or stenosis of unspecified cerebral artery: Secondary | ICD-10-CM

## 2012-12-06 DIAGNOSIS — F329 Major depressive disorder, single episode, unspecified: Secondary | ICD-10-CM

## 2012-12-06 DIAGNOSIS — IMO0002 Reserved for concepts with insufficient information to code with codable children: Secondary | ICD-10-CM

## 2012-12-06 DIAGNOSIS — I639 Cerebral infarction, unspecified: Secondary | ICD-10-CM

## 2012-12-06 DIAGNOSIS — M792 Neuralgia and neuritis, unspecified: Secondary | ICD-10-CM

## 2012-12-06 MED ORDER — VENLAFAXINE HCL ER 75 MG PO CP24: 75.0000 mg | ORAL_CAPSULE | Freq: Every day | ORAL | Status: DC

## 2012-12-06 MED ORDER — GABAPENTIN 400 MG PO CAPS: 800.0000 mg | ORAL_CAPSULE | Freq: Four times a day (QID) | ORAL | Status: DC
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
## 2012-12-06 NOTE — Patient Instructions (Signed)
Increase Gabapentin 400mg   By adding 1 capsule a day each week  until you are taking 2 capsules 4 times a day. For a total of 3200 mg daily.  Example:  WEEK 1:  Morning 1 caps, Lunch 1 cap, Dinner 1 cap, Night 2 caps. WEEK 2:  Morning 1 caps, Lunch 1 cap, Dinner 2 cap, Night 2 caps. WEEK 3:  Morning 1 caps, Lunch 2 cap, Dinner 2 cap, Night 2 caps. WEEK 4:  Morning 2 caps, Lunch 2 cap, Dinner 2 cap, Night 2 caps.  If you get too drowsy with the added doses, cut back on the amount.  We have ordered Effexor (Venlafaxine) 75 mg to help with depression and anxiety.  Return to office in 2 months for follow up visit.  STROKE/TIA INSTRUCTIONS SMOKING Cigarette smoking nearly doubles your risk of having a stroke & is the single most alterable risk factor  If you smoke or have smoked in the last 12 months, you are advised to quit smoking for your health.  Most of the excess cardiovascular risk related to smoking disappears within a year of stopping.  Ask you doctor about anti-smoking medications  [place] Quit Line: 1-800-QUIT NOW  Free Smoking Cessation Classes ([phone]  CHOLESTEROL Know your levels; limit fat & cholesterol in your diet  Lab Results  Component Value Date   CHOL 146 07/29/2012   HDL 47 07/29/2012   LDLCALC 81 07/29/2012   TRIG 92 07/29/2012   CHOLHDL 3.1 07/29/2012      Many patients benefit from treatment even if their cholesterol is at goal.  Goal: Total Cholesterol less than 160  Goal:  LDL less than 100  Goal:  HDL greater than 40  Goal:  Triglycerides less than 150  BLOOD PRESSURE American Stroke Association blood pressure target is less that 120/80 mm/Hg  Your discharge blood pressure is:  BP: 134/74 mmHg  Monitor your blood pressure  Limit your salt and alcohol intake  Many individuals will require more than one medication for high blood pressure  DIABETES (A1c is a blood sugar average for last 3 months) Goal A1c is under 7% (A1c is blood sugar average for  last 3 months)  Diabetes: Diagnosis of diabetes:  A1c was not drawn this admission    Lab Results  Component Value Date   HGBA1C 6.3* 07/30/2012    Your A1c can be lowered with medications, healthy diet, and exercise.  Check your blood sugar as directed by your physician  Call your physician if you experience unexplained or low blood sugars.  PHYSICAL ACTIVITY/REHABILITATION Goal is 30 minutes at least 4 days per week    Activity decreases your risk of heart attack and stroke and makes your heart stronger.  It helps control your weight and blood pressure; helps you relax and can improve your mood.  Participate in a regular exercise program.  Talk with your doctor about the best form of exercise for you (dancing, walking, swimming, cycling).  DIET/WEIGHT Goal is to maintain a healthy weight  Your height is:  Height: 5\' 7"  (170.2 cm) Your current weight is: Weight: 212 lb (96.163 kg) Your body Mass Index (BMI) is:  BMI (Calculated): 33.3  Following the type of diet specifically designed for you will help prevent another stroke.  Your goal Body Mass Index (BMI) is 19-24.  Healthy food habits can help reduce 3 risk factors for stroke:  High cholesterol, hypertension, and excess weight.

## 2012-12-29 ENCOUNTER — Other Ambulatory Visit: Payer: Self-pay | Admitting: Physical Medicine & Rehabilitation

## 2013-02-05 ENCOUNTER — Ambulatory Visit (INDEPENDENT_AMBULATORY_CARE_PROVIDER_SITE_OTHER): Payer: Medicare Other | Admitting: Nurse Practitioner

## 2013-02-05 ENCOUNTER — Encounter: Payer: Self-pay | Admitting: Nurse Practitioner

## 2013-02-05 VITALS — BP 125/76 | HR 64 | Temp 98.6°F | Ht 67.0 in | Wt 213.0 lb

## 2013-02-05 DIAGNOSIS — I639 Cerebral infarction, unspecified: Secondary | ICD-10-CM

## 2013-02-05 DIAGNOSIS — F329 Major depressive disorder, single episode, unspecified: Secondary | ICD-10-CM

## 2013-02-05 DIAGNOSIS — IMO0002 Reserved for concepts with insufficient information to code with codable children: Secondary | ICD-10-CM

## 2013-02-05 DIAGNOSIS — G4730 Sleep apnea, unspecified: Secondary | ICD-10-CM

## 2013-02-05 DIAGNOSIS — F3289 Other specified depressive episodes: Secondary | ICD-10-CM

## 2013-02-05 DIAGNOSIS — I6350 Cerebral infarction due to unspecified occlusion or stenosis of unspecified cerebral artery: Secondary | ICD-10-CM

## 2013-02-05 DIAGNOSIS — M792 Neuralgia and neuritis, unspecified: Secondary | ICD-10-CM

## 2013-02-05 MED ORDER — DULOXETINE HCL 60 MG PO CPEP: 60.0000 mg | ORAL_CAPSULE | Freq: Every day | ORAL | Status: DC
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
## 2013-02-05 NOTE — Patient Instructions (Addendum)
Continue aspirin 325 mg orally every day  for secondary stroke prevention and maintain strict control of hypertension with blood pressure goal below 130/90, diabetes with hemoglobin A1c goal below 6.5% and lipids with LDL cholesterol goal below 100 mg/dL. Referral to sleep [facility] for Sleep Apnea.  Wore cpap in the past.  Start Cymbalta 30 mg daily for 2 weeks.  2 weeks of samples given. After 2 weeks, increase to Cymblata 60 mg.  Take 1 daily. Followup in the future with me in 6 weeks.

## 2013-03-07 ENCOUNTER — Telehealth: Payer: Self-pay | Admitting: Neurology

## 2013-03-07 DIAGNOSIS — G4733 Obstructive sleep apnea (adult) (pediatric): Secondary | ICD-10-CM

## 2013-03-07 NOTE — Telephone Encounter (Signed)
refers patient for attended sleep study. Sandy Guile, NP)  Height:5'7  Weight: 231lb  BMI: 33.35  Past Medical History: Stroke, Hypertension, Aortic Aneurysm, Diabetes and OSA  Sleep Symptoms: OSA, patient hasn't used CPAP machine in 73yrs  Epworth Score:  Medications: Amlodipine 10 mg daily, ASA 325 daily,Cymbalta 60 mg daily, Gabapentin 400 mg 2 caps. x2, Levemir 18 units x2, Lisinopril 10 mg daily and Simvastatin 40 mg daily  Insurance: [facility] Medicare/Medicaid    Please review patient information and submit instructions for scheduling and orders for sleep technologist.  Thank you!     I called and left a message for the patient to callback to the office to schedule her sleep study.

## 2013-03-07 NOTE — Telephone Encounter (Signed)
I have entered a split-night study request, thanks sa

## 2013-03-07 NOTE — Sleep Study (Signed)
This patient has multiple cardiovascular risk factors as well as the prior diagnosis of OSA but has not used CPAP in over 3 years. She needs reevaluation and treatment for her OSA. I will enter a split-night sleep study request.

## 2013-03-18 ENCOUNTER — Telehealth: Payer: Self-pay | Admitting: Nurse Practitioner

## 2013-03-18 NOTE — Telephone Encounter (Signed)
Call pt about her appt. And the pt acknowledge that she was coming. °

## 2013-03-19 ENCOUNTER — Encounter: Payer: Self-pay | Admitting: Nurse Practitioner

## 2013-03-19 ENCOUNTER — Ambulatory Visit (INDEPENDENT_AMBULATORY_CARE_PROVIDER_SITE_OTHER): Payer: Medicare Other | Admitting: Nurse Practitioner

## 2013-03-19 VITALS — BP 135/86 | HR 71 | Temp 98.5°F | Ht 67.25 in | Wt 213.0 lb

## 2013-03-19 DIAGNOSIS — R209 Unspecified disturbances of skin sensation: Secondary | ICD-10-CM

## 2013-03-19 DIAGNOSIS — M792 Neuralgia and neuritis, unspecified: Secondary | ICD-10-CM

## 2013-03-19 DIAGNOSIS — F3289 Other specified depressive episodes: Secondary | ICD-10-CM

## 2013-03-19 DIAGNOSIS — F329 Major depressive disorder, single episode, unspecified: Secondary | ICD-10-CM

## 2013-03-19 DIAGNOSIS — IMO0002 Reserved for concepts with insufficient information to code with codable children: Secondary | ICD-10-CM

## 2013-03-19 DIAGNOSIS — M317 Microscopic polyangiitis: Secondary | ICD-10-CM

## 2013-03-19 DIAGNOSIS — M300 Polyarteritis nodosa: Secondary | ICD-10-CM

## 2013-03-19 DIAGNOSIS — I6350 Cerebral infarction due to unspecified occlusion or stenosis of unspecified cerebral artery: Secondary | ICD-10-CM

## 2013-03-19 DIAGNOSIS — I639 Cerebral infarction, unspecified: Secondary | ICD-10-CM

## 2013-03-19 DIAGNOSIS — E669 Obesity, unspecified: Secondary | ICD-10-CM | POA: Insufficient documentation

## 2013-03-19 MED ORDER — DULOXETINE HCL 60 MG PO CPEP: 60.0000 mg | ORAL_CAPSULE | Freq: Every day | ORAL | Status: DC
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
## 2013-03-19 NOTE — Patient Instructions (Signed)
Continue aspirin 325 mg orally every day for secondary stroke prevention and maintain strict control of hypertension with blood pressure goal below 130/90, diabetes with hemoglobin A1c goal below 6.5% and lipids with LDL cholesterol goal below 100 mg/dL.   Continue Gabapentin 800mg  four times a day.  Continue Cymblata 60 mg for neuropathic pain. Follow up with me in 6 months

## 2013-04-11 ENCOUNTER — Ambulatory Visit (INDEPENDENT_AMBULATORY_CARE_PROVIDER_SITE_OTHER): Payer: Medicare Other | Admitting: Neurology

## 2013-04-11 DIAGNOSIS — G4733 Obstructive sleep apnea (adult) (pediatric): Secondary | ICD-10-CM

## 2013-04-11 DIAGNOSIS — R9431 Abnormal electrocardiogram [ECG] [EKG]: Secondary | ICD-10-CM

## 2013-04-11 DIAGNOSIS — G479 Sleep disorder, unspecified: Secondary | ICD-10-CM

## 2013-04-19 ENCOUNTER — Telehealth: Payer: Self-pay | Admitting: Neurology

## 2013-04-19 DIAGNOSIS — G4733 Obstructive sleep apnea (adult) (pediatric): Secondary | ICD-10-CM

## 2013-04-19 NOTE — Telephone Encounter (Signed)
Please call and notify the patient that the recent sleep study did confirm the diagnosis of obstructive sleep apnea, this is moderate to severe and in light of her medical Hx, I strongly recommend treatment for this in the form of CPAP. This will require a repeat sleep study for proper titration and mask fitting. Please explain to patient and arrange for a CPAP titration study. I have placed an order in the chart. Thanks, Huston Foley, MD, PhD [facility])

## 2013-04-22 NOTE — Telephone Encounter (Signed)
I called and left a message for the patient that her recent sleep study did confirm the diagnosis of obstructive sleep apnea and that Dr. Frances Furbish would like for her to have CPAP Titration study to get her setting for the CPAP machine and mask fitting. I ask the patient to callback to the office to speak with Jasmine December concerning setting up the CPAP study.

## 2013-04-23 ENCOUNTER — Encounter: Payer: Self-pay | Admitting: *Deleted

## 2013-04-24 ENCOUNTER — Telehealth: Payer: Self-pay | Admitting: Neurology

## 2013-04-24 NOTE — Telephone Encounter (Signed)
I called and left a message for the patient to callback to the office to schedule her CPAP Titration Study.

## 2013-05-09 ENCOUNTER — Other Ambulatory Visit: Payer: Self-pay

## 2013-05-14 ENCOUNTER — Ambulatory Visit (INDEPENDENT_AMBULATORY_CARE_PROVIDER_SITE_OTHER): Payer: Medicare Other

## 2013-05-14 DIAGNOSIS — G479 Sleep disorder, unspecified: Secondary | ICD-10-CM

## 2013-05-14 DIAGNOSIS — G4733 Obstructive sleep apnea (adult) (pediatric): Secondary | ICD-10-CM

## 2013-05-24 ENCOUNTER — Telehealth: Payer: Self-pay | Admitting: Neurology

## 2013-05-24 DIAGNOSIS — G4733 Obstructive sleep apnea (adult) (pediatric): Secondary | ICD-10-CM

## 2013-05-24 NOTE — Telephone Encounter (Signed)
Please call and inform patient that I have entered an order for treatment with PAP. We will arrange for a machine through a DME company and I will see the patient back in follow-up in about 6 weeks. I will be looking out for compliance data downloaded from the machine at the time of the followup appointment and discuss how it is going with PAP treatment at the time of the visit. Please also make sure, the patient has a new pt appointment with me in 6 weeks from the setup date, thanks.  Huston Foley, MD, PhD [facility])

## 2013-05-27 ENCOUNTER — Encounter: Payer: Self-pay | Admitting: *Deleted

## 2013-05-27 NOTE — Telephone Encounter (Signed)
I called and left a message for the patient that Dr. Frances Furbish is recommending that she start CPAP Therapy at home and that I will send her order to [facility] and they will contact her. I also informed the patient that I will mail her results along with a letter with instruction on follow visit and [facility] phone number. I will fax a copy to Heide Guile, NP and to Dr. Parke Simmers.

## 2013-09-03 ENCOUNTER — Encounter: Payer: Self-pay | Admitting: Neurology

## 2013-09-10 ENCOUNTER — Encounter: Payer: Self-pay | Admitting: Neurology

## 2013-09-10 ENCOUNTER — Ambulatory Visit (INDEPENDENT_AMBULATORY_CARE_PROVIDER_SITE_OTHER): Payer: Medicare Other | Admitting: Neurology

## 2013-09-10 VITALS — BP 138/90 | HR 80 | Temp 98.1°F | Ht 66.0 in | Wt 213.0 lb

## 2013-09-10 DIAGNOSIS — E785 Hyperlipidemia, unspecified: Secondary | ICD-10-CM

## 2013-09-10 DIAGNOSIS — E669 Obesity, unspecified: Secondary | ICD-10-CM

## 2013-09-10 DIAGNOSIS — R209 Unspecified disturbances of skin sensation: Secondary | ICD-10-CM

## 2013-09-10 DIAGNOSIS — Z9989 Dependence on other enabling machines and devices: Principal | ICD-10-CM

## 2013-09-10 DIAGNOSIS — I10 Essential (primary) hypertension: Secondary | ICD-10-CM

## 2013-09-10 DIAGNOSIS — I639 Cerebral infarction, unspecified: Secondary | ICD-10-CM

## 2013-09-10 DIAGNOSIS — I6350 Cerebral infarction due to unspecified occlusion or stenosis of unspecified cerebral artery: Secondary | ICD-10-CM

## 2013-09-10 DIAGNOSIS — G4733 Obstructive sleep apnea (adult) (pediatric): Secondary | ICD-10-CM

## 2013-09-10 DIAGNOSIS — E119 Type 2 diabetes mellitus without complications: Secondary | ICD-10-CM

## 2013-09-10 NOTE — Patient Instructions (Addendum)
Please continue using your CPAP regularly. While your insurance requires that you use CPAP at least 4 hours each night on 70% of the nights, I recommend, that you not skip any nights and use it throughout the night if you can. Getting used to CPAP does take time and patience and discipline. Untreated obstructive sleep apnea when it is moderate to severe can have an adverse impact on cardiovascular health and raise her risk for heart disease, arrhythmias, hypertension, congestive heart failure, stroke and diabetes. Untreated obstructive sleep apnea causes sleep disruption, nonrestorative sleep, and sleep deprivation. This can have an impact on your day to day functioning and cause daytime sleepiness and impairment of cognitive function, memory loss, mood disturbance, and problems focussing. Using CPAP regularly can improve these symptoms.  Continue exercises and your medications as directed. As discussed, secondary prevention is key after a stroke. This means: taking care of blood sugar values or diabetes management, good blood pressure (hypertension) control and optimizing cholesterol management, exercising daily or regularly within your own mobility limitations of course and overall cardiovascular risk factor reduction, which includes screening for and treatment of obstructive sleep apnea (OSA).
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
## 2013-09-18 ENCOUNTER — Encounter: Payer: Self-pay | Admitting: Nurse Practitioner

## 2013-09-18 ENCOUNTER — Ambulatory Visit (INDEPENDENT_AMBULATORY_CARE_PROVIDER_SITE_OTHER): Payer: Medicare Other | Admitting: Nurse Practitioner

## 2013-09-18 VITALS — BP 129/88 | HR 61 | Temp 98.6°F | Ht 66.0 in | Wt 214.0 lb

## 2013-09-18 DIAGNOSIS — F329 Major depressive disorder, single episode, unspecified: Secondary | ICD-10-CM

## 2013-09-18 DIAGNOSIS — M792 Neuralgia and neuritis, unspecified: Secondary | ICD-10-CM

## 2013-09-18 DIAGNOSIS — IMO0002 Reserved for concepts with insufficient information to code with codable children: Secondary | ICD-10-CM

## 2013-09-18 DIAGNOSIS — R209 Unspecified disturbances of skin sensation: Secondary | ICD-10-CM

## 2013-09-18 DIAGNOSIS — F3289 Other specified depressive episodes: Secondary | ICD-10-CM

## 2013-09-18 DIAGNOSIS — I6350 Cerebral infarction due to unspecified occlusion or stenosis of unspecified cerebral artery: Secondary | ICD-10-CM

## 2013-09-18 DIAGNOSIS — G4730 Sleep apnea, unspecified: Secondary | ICD-10-CM

## 2013-09-18 MED ORDER — GABAPENTIN 400 MG PO CAPS: 1200.0000 mg | ORAL_CAPSULE | Freq: Four times a day (QID) | ORAL | Status: DC

## 2013-09-18 MED ORDER — DULOXETINE HCL 60 MG PO CPEP: 60.0000 mg | ORAL_CAPSULE | Freq: Every day | ORAL | Status: DC

## 2013-09-18 NOTE — Patient Instructions (Addendum)
Continue aspirin 325 mg orally every day for secondary stroke prevention and maintain strict control of hypertension with blood pressure goal below 140/90, diabetes with hemoglobin A1c goal below 7% and lipids with LDL cholesterol goal below 70 mg/dL.   Continue Gabapentin and Continue Cymblata 60 mg for neuropathic pain.  Follow up in 6 months.

## 2013-09-18 NOTE — Progress Notes (Signed)
PATIENT: Sandy Davis DOB: [birth year]  REASON FOR VISIT: follow up for stroke HISTORY FROM: patient  HISTORY OF PRESENT ILLNESS: Sandy Davis is a 58 y.o. female who felt dizzy and weak and then fell down to the floor, and began to experience blurry vision, slurring of her speech and right sided weakness on 07/28/12. She was found to have acute infarcts of the left cerebellum and left inferior occipital lobe and a subacute lacunar infarct in the Superior Left Thalmus. Carotid Doppler showed no ICA stenosis. An incidental note of solid right thigh rhythm as noted. TEE was done and showed no thrombus, PFO, AST, and normal LVH. Hypercoagulable panel was negative for lupus anticoagulant and negative for factor V Leiden. 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 She was placed on Aspirin 325mg  for secondary stroke prevention. She completed 4 weeks of inpatient rehabilitation.   Date 12/06/2012: Patient comes in today for routine stroke followup. She states her speech has gotten better but still has mild Word finding trouble. And she states her short-term memory is still not back to baseline and she is very frustrated with not being able to remember where she puts things and things her children say that they've told her. She still has significant numbness and paresthesia in both her right arm and hand and right leg. She has been taking gabapentin 400 mg 4 times a day and states that it is not helping. She states her blood sugars have been hard to control, ranging from Lows in the 60s to high as in the 140s to 150s. She says she is due for lab work and hemoglobin A1c in July. She ambulates with a single pronged cane for stability. She reports no falls. Patient denies medication side effects, with no signs of bleeding. Patient expresses frustration many times during the visit that she is not able to do the things that she wants to do and is having  trouble with her memory.   UPDATE 02/05/13 (LL): Patient returns for follow up since last visit on 12/06/12. She reports that she increased the Gabapentin to 800 mg four times a day and thinks it helps some but she still has unpleasant pain and tingling in her right arm, hand, leg and foot. She states she is bothered by it more at night. She is using her cane less. She thinks her memory and general mood is a little better. She did not see any benefit from taking Venlafaxine for 4 weeks so she stopped taking it. She has been taking Aspirin 325 mg daily. Patient denies medication side effects, with no signs of bleeding or excessive bruising.   UPDATE 03/19/13 (LL): Patient returns for 6 week follow up after adding Cymbalta for her neuropathic pain and paresthesias. She states that it really does help and it helps with her mood which she likes too. She no longer needs to use a cane. Her BP is under good control and her blood sugars are better controlled now too, highs in the 130-140 range. Sleep study is scheduled for next week with Dr. Rexene Alberts.   UPDATE 09/18/13 (LL):  Sandy Davis returns for 6 month stroke follow up. She had a sleep study in our office and she is now on treatment with CPAP of 11 cm of water pressure. She reports that her paresthesias and allodynia on the right arm , hand, and leg are the same, tolerable with Gabapentin, 1200 mg 3-4 times daily.  Depression  is stable on Cymbalta.  BP is well controlled, it is 129/88 in office today.  She states her blood sugars have been up and down, hard to control. She is working with her PCP on this.  No new complaints.  REVIEW OF SYSTEMS: Full 14 system review of systems performed and notable only for:  Light sensitivity, apnea, memory loss, numbness, depression  ALLERGIES: No Known Allergies  HOME MEDICATIONS: Outpatient Prescriptions Prior to Visit  Medication Sig Dispense Refill  . amLODipine (NORVASC) 10 MG tablet Take 10 mg by mouth daily.        Marland Kitchen aspirin 325 MG tablet Take 1 tablet (325 mg total) by mouth daily.      . clobetasol ointment (TEMOVATE) 0.05 %       . DULoxetine (CYMBALTA) 60 MG capsule Take 1 capsule (60 mg total) by mouth daily.  90 capsule  3  . gabapentin (NEURONTIN) 400 MG capsule Take 2 capsules (800 mg total) by mouth 4 (four) times daily.  240 capsule  6  . insulin detemir (LEVEMIR) 100 UNIT/ML injection Inject 18 Units into the skin 2 (two) times daily.  10 mL    . lisinopril (PRINIVIL,ZESTRIL) 10 MG tablet       . ONETOUCH DELICA LANCETS FINE MISC       . ONETOUCH VERIO test strip       . simvastatin (ZOCOR) 40 MG tablet        No facility-administered medications prior to visit.     PHYSICAL EXAM  Filed Vitals:   09/18/13 1336  BP: 129/88  Pulse: 61  Temp: 98.6 F (37 C)  TempSrc: Oral  Height: 5\' 6"  (1.676 m)  Weight: 214 lb (97.07 kg)   Body mass index is 34.56 kg/(m^2).  Generalized: In no acute distress  Neck: Supple, no carotid bruits  Cardiac: Regular rate rhythm, soft 2/6 murmur  Pulmonary: Clear to auscultation bilaterally  Musculoskeletal: No deformity   NEUROLOGIC:  MENTAL STATUS: awake, alert and oriented to person, place and time, MMSE not tested today. GDS 4. (Last Visit: MILD WORD-FINDING DIFFICULTIES, MOCA score 19/30 (17/30 last visit) WITH DEFICITS IN RECALL, ABSTRACTION & ATTENTION. GERIATRIC DEPRESSION SCORE 6 SUGGESTS DEPRESSION (8 last visit). )  CRANIAL NERVE: pupils equal and reactive to light, visual fields full to confrontation, extraocular muscles intact, no nystagmus, facial sensation DECREASED ON RIGHT and strength symmetric, uvula midline, shoulder shrug symmetric, tongue midline.  MOTOR: normal bulk and tone, DECREASED ON RUE AND RLE 4/5, LEFT 5/5, decreased fine finger movements on RIGHT  SENSORY: decreased to light touch on RUE, RLE, R FACE, ALLODYNIA ON RUE/RLE  COORDINATION: finger-nose-finger normal, heel-shin normal  REFLEXES: deep tendon reflexes present  and symmetric 2+  GAIT/STATION: narrow based gait; able to walk on toes, heels. Mild difficulty with Tandem romberg is negative.    ASSESSMENT AND PLAN Sandy Davis is a 58 year old AA female here for follow up of infarcts of the left cerebellum and left inferior occipital lobe and a subacute lacunar infarct in the Superior Left Thalmus on 07/28/12. Her vascular risk factors are diabetes, hyperlipidemia, history microscopic polyangiitis, sleep apnea and obesity. Her paresthesias and allodynia on her right hand and leg are tolerable with increased Gabapentin; Depression is stable on Cymbalta.  PLAN: Continue aspirin 325 mg orally every day for secondary stroke prevention and maintain strict control of hypertension with blood pressure goal below 130/90, diabetes with hemoglobin A1c goal below 6.5% and lipids with LDL cholesterol goal below 100  mg/dL.   Continue Gabapentin 1200 mg 3-4 times a day.  Continue Cymblata 60 mg for neuropathic pain.  Follow up with me in 6 months.   Philmore Pali, MSN, NP-C 09/18/2013, 1:46 PM [facility] [address], [place], [place] [address] [phone]  Note: This document was prepared with digital dictation and possible smart phrase technology. Any transcriptional errors that result from this process are unintentional.

## 2013-10-03 ENCOUNTER — Encounter: Payer: Self-pay | Admitting: Neurology

## 2013-10-03 DIAGNOSIS — I639 Cerebral infarction, unspecified: Secondary | ICD-10-CM

## 2013-10-03 DIAGNOSIS — G4733 Obstructive sleep apnea (adult) (pediatric): Secondary | ICD-10-CM

## 2013-10-03 DIAGNOSIS — Z9989 Dependence on other enabling machines and devices: Principal | ICD-10-CM

## 2013-10-11 NOTE — Progress Notes (Signed)
Quick Note:  I reviewed the patient's CPAP compliance data from 09/02/2013 to 10/01/2013, which is a total of 30 days, during which time the patient used CPAP every day. The average usage for all days was 6 hours and 29 minutes. The percent used days greater than 4 hours was 93%, indicating excellent compliance. The residual AHI was elevated at 9.1 per hour, indicating a suboptimal treatment pressure of 12 cwp with EPR of 3. I would like to try the patient on a pressure of 13 cm. I also will review this data with the patient at the next office visit, which is currently routinely scheduled for 01/31/2014 at 11:30 AM, provide feedback and additional troubleshooting if need be. I will send an order of the increase in pressure to her current DME company, [facility].  Star Age, MD, PhD [facility] ([facility])   ______

## 2013-10-14 ENCOUNTER — Encounter: Payer: Self-pay | Admitting: Neurology

## 2013-10-15 ENCOUNTER — Encounter: Payer: Self-pay | Admitting: Neurology

## 2013-10-17 NOTE — Progress Notes (Signed)
Quick Note:  I reviewed the patient's CPAP compliance data from 09/13/13 to 10/12/13, which is a total of 30 days, during which time the patient used CPAP every day. The average usage for all days was 7 hours and 13 minutes. The percent used days greater than 4 hours was 97%, indicating excellent compliance. The residual AHI was 9 per hour, indicating a suboptimal treatment pressure of 12 cwp with EPR of 3. Air leak from the mask was fairly high and improving leak may help bring down the AHI. I will review this data with the patient at the next office visit, which is currently routinely scheduled for 01/31/14, provide feedback and additional troubleshooting if need be. In the meantime, the patient is encouraged to work with her [facility] to improved airleak around the mask and better mask fitting.   Star Age, MD, PhD [facility] ([facility])   ______

## 2013-10-30 ENCOUNTER — Encounter: Payer: Self-pay | Admitting: Neurology

## 2013-11-18 DIAGNOSIS — Z0289 Encounter for other administrative examinations: Secondary | ICD-10-CM

## 2014-01-08 ENCOUNTER — Encounter: Payer: Self-pay | Admitting: Neurology

## 2014-01-17 ENCOUNTER — Telehealth: Payer: Self-pay | Admitting: *Deleted

## 2014-01-17 MED ORDER — GABAPENTIN 400 MG PO CAPS: 1200.0000 mg | ORAL_CAPSULE | Freq: Four times a day (QID) | ORAL | Status: DC

## 2014-01-17 MED ORDER — DULOXETINE HCL 60 MG PO CPEP: 60.0000 mg | ORAL_CAPSULE | Freq: Every day | ORAL | Status: DC

## 2014-01-17 NOTE — Telephone Encounter (Signed)
Rx's have been sent. 

## 2014-01-31 ENCOUNTER — Ambulatory Visit: Payer: Medicare Other | Admitting: Neurology

## 2014-02-03 IMAGING — CT CT ANGIO CHEST
3 of 7 series · 19 of 46 positions shown · IV contrast (APPLIED)
Comparison: None.

CLINICAL DATA: Cerebral infarction and reported history of thoracic
aortic aneurysm.

CT ANGIOGRAPHY CHEST
TECHNIQUE: Multidetector CT imaging of the chest using the
standard protocol during bolus administration of intravenous
contrast. Multiplanar reconstructed images including MIPs were
obtained and reviewed to evaluate the vascular anatomy.
Contrast: 75mL OMNIPAQUE IOHEXOL 350 MG/ML SOLN

[Series 5: dissection 2.0 st · axial · 0.70mm/px · z∈[-280,-42]mm · 12 of 141 slices shown]
[im 11/141  lung]
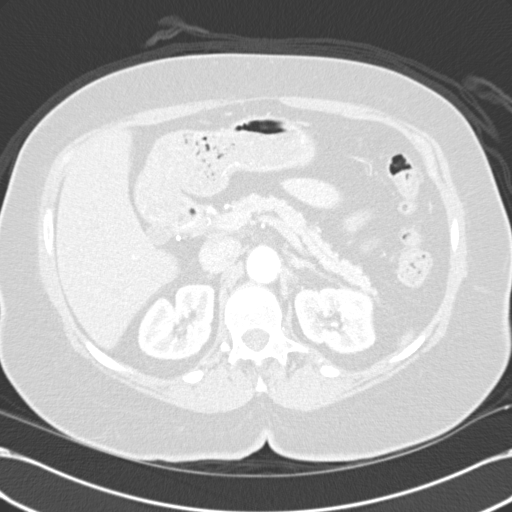
[im 22/141  soft-tissue]
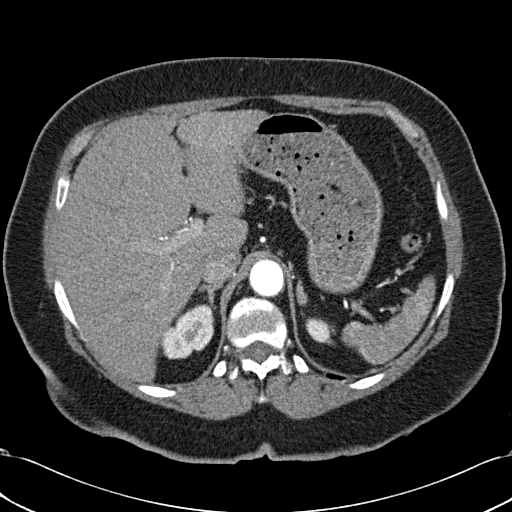
[im 33/141  lung]
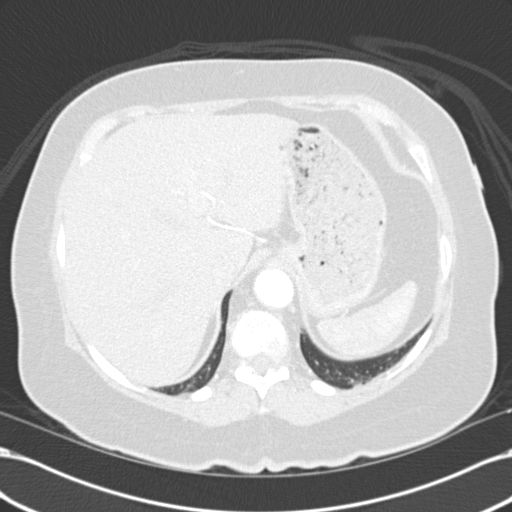
[im 44/141  soft-tissue]
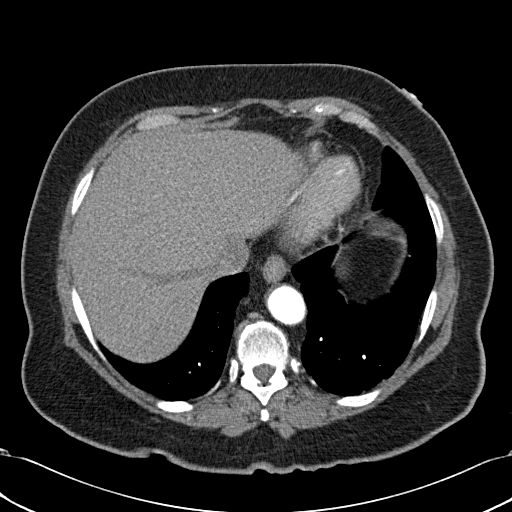
[im 54/141  lung]
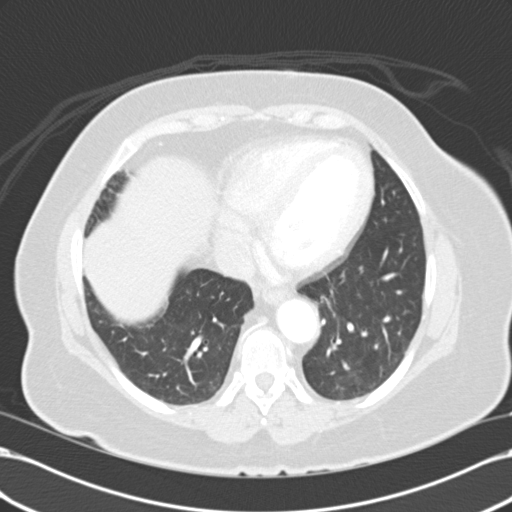
[im 65/141  soft-tissue]
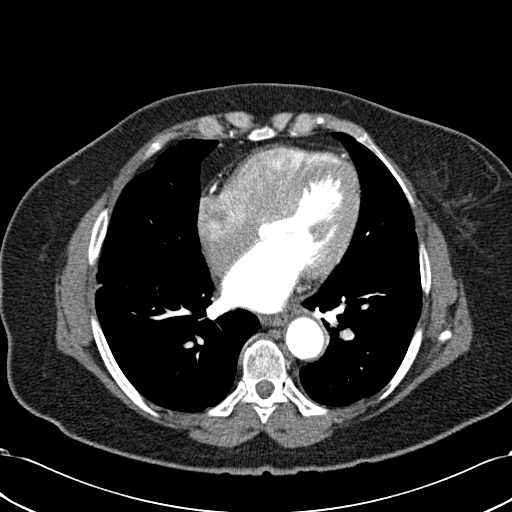
[im 76/141  lung]
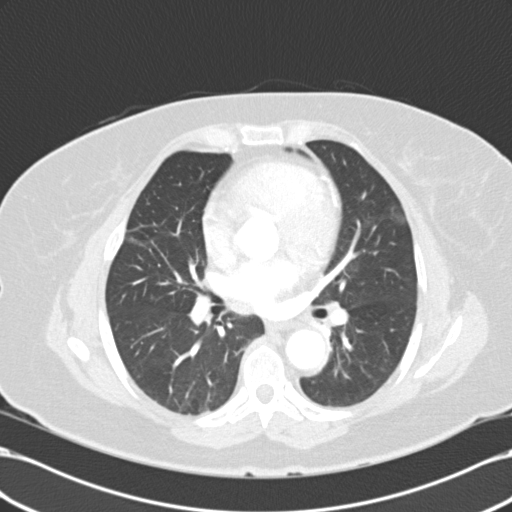
[im 87/141  soft-tissue]
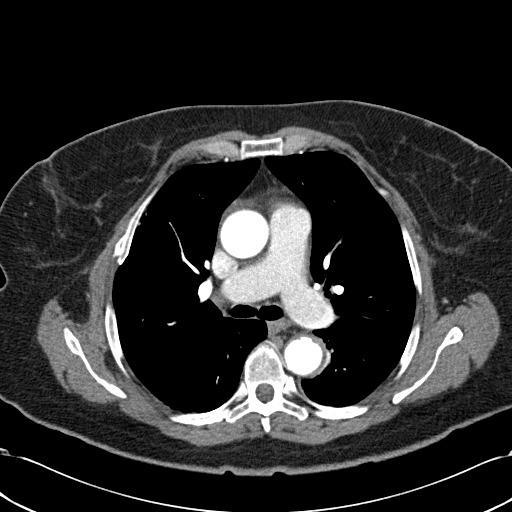
[im 97/141  lung]
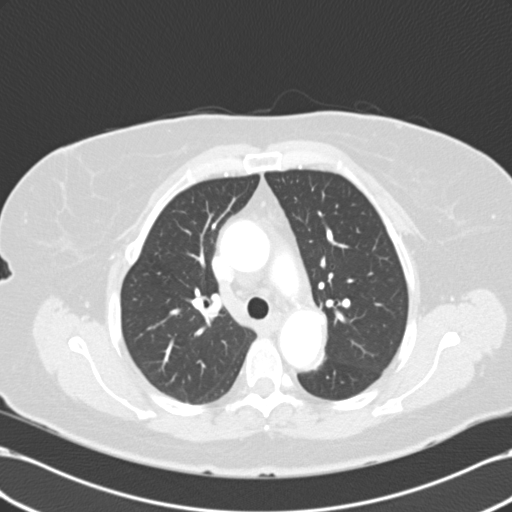
[im 108/141  soft-tissue]
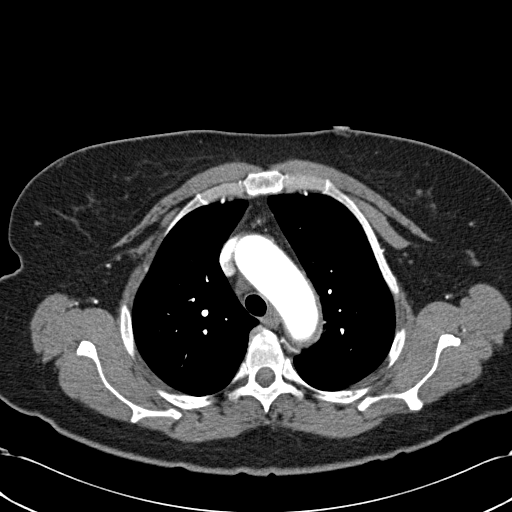
[im 119/141  lung]
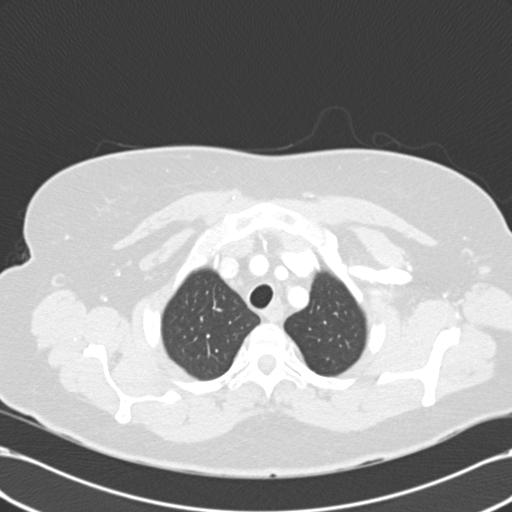
[im 130/141  soft-tissue]
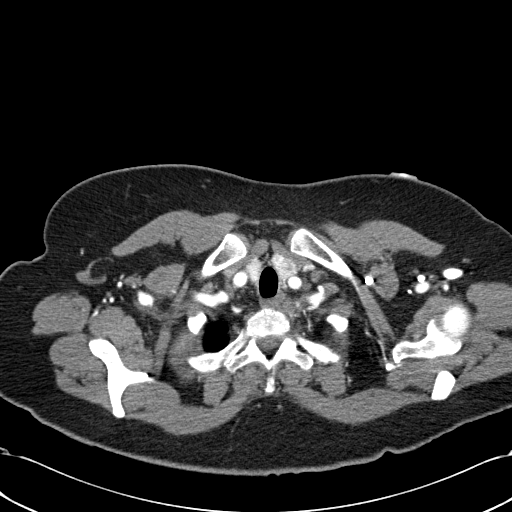

[Series 6: dissection 2.0 lung · axial · 0.70mm/px · z∈[-246,-164]mm · 4 of 124 slices shown]
[im 11/124  soft-tissue]
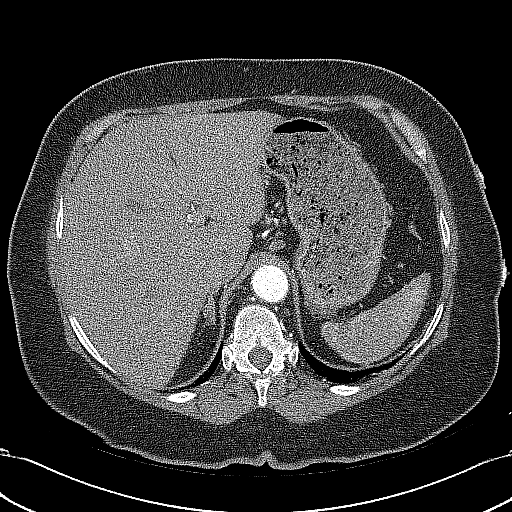
[im 31/124  soft-tissue]
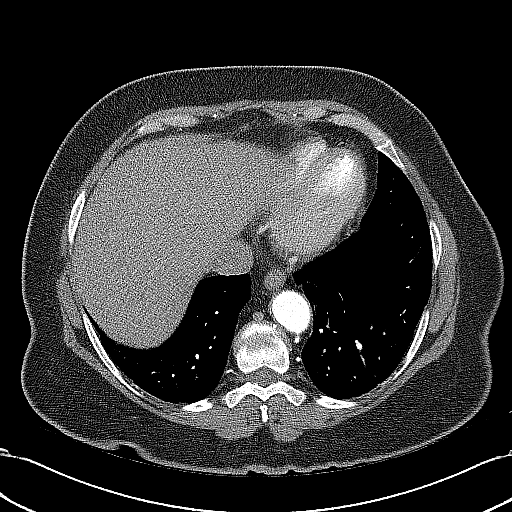
[im 42/124  soft-tissue]
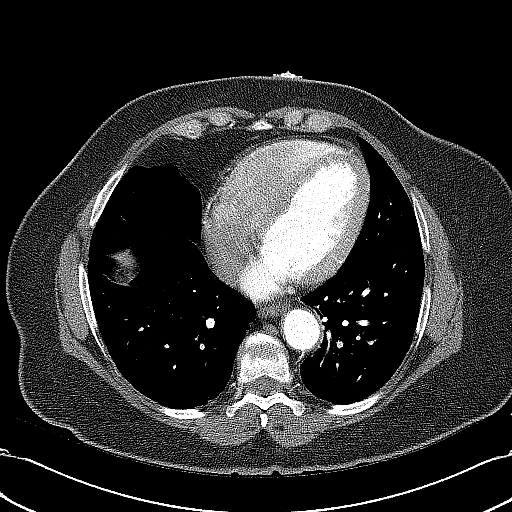
[im 52/124  soft-tissue]
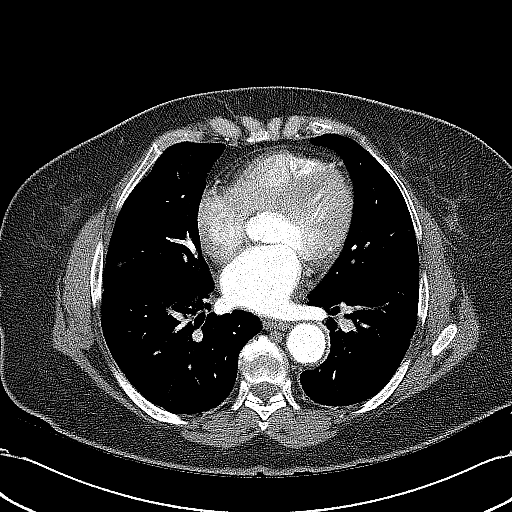

[Series 8: coronals · coronal · 0.66mm/px · 3 of 118 slices shown]
[im 30/118  soft-tissue]
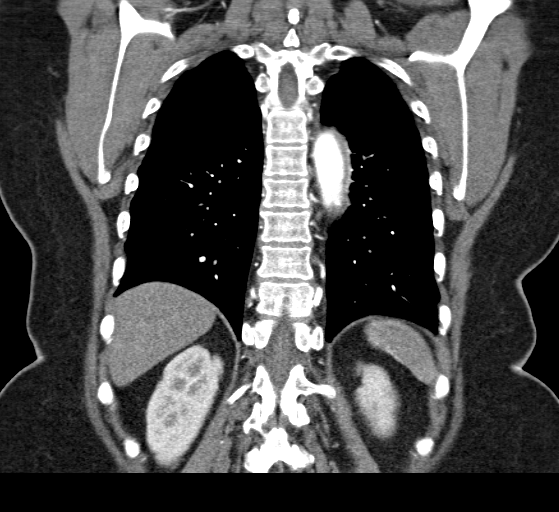
[im 59/118  soft-tissue]
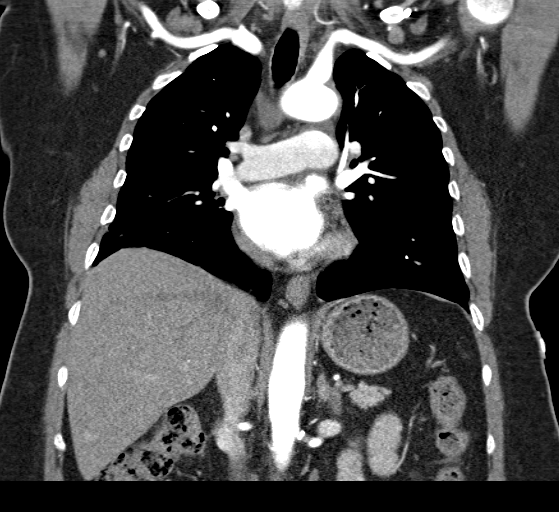
[im 88/118  soft-tissue]
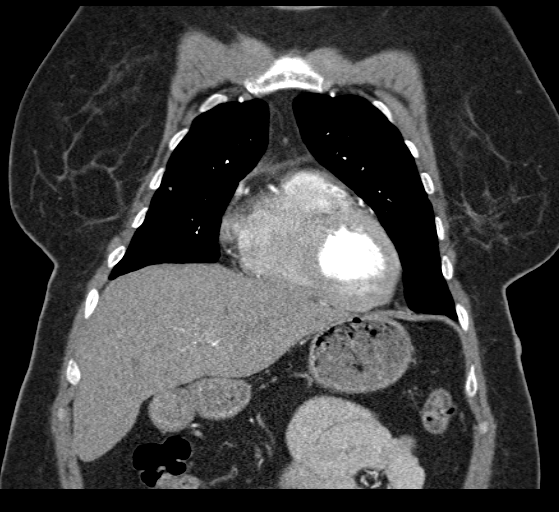

[19 of 46 positions shown; findings below may reference images not displayed]

FINDINGS: The thoracic aorta is not dilated.  The ascending
thoracic aorta measures 3.5 cm in greatest diameter.  The aortic
arch measures 3.1 cm in diameter.  The descending thoracic aorta
measures 2.9 cm.  There is no evidence of significant
atherosclerotic disease or aortic dissection.  Visualized proximal
great vessels show normal branching anatomy and patency. The
proximal right vertebral artery is demonstrated to be normally
patent.  A left vertebral artery is not demonstrated and may be
hypoplastic or chronically occluded.

The heart size is normal.  No pleural or pericardial effusions.
Lung windows show no evidence of pulmonary infiltrates, edema,
nodules or significant lung disease.  There are some scattered
areas of parenchymal scarring bilaterally.

No enlarged lymph nodes.  The bony thorax is unremarkable.
IMPRESSION: The thoracic aorta is of normal caliber and demonstrates no
evidence of aneurysmal disease.  No acute aortic pathology is
identified.  Incidental note made of either hypoplasia or chronic
occlusion of the proximal left vertebral artery.

## 2014-02-04 IMAGING — US US SOFT TISSUE HEAD/NECK
1 series · 14 of 25 positions shown · non-contrast
Comparison: Carotid Doppler report performed on 07/29/2012

CLINICAL DATA: 56-year-old female with right thyroid mass
identified on recent carotid ultrasound.

THYROID ULTRASOUND
TECHNIQUE: Ultrasound examination of the thyroid gland and adjacent
soft tissues was performed.

[Series 1: us soft tissue head/neck · 0.08mm/px · 14 of 52 slices shown]
[im 1/52]
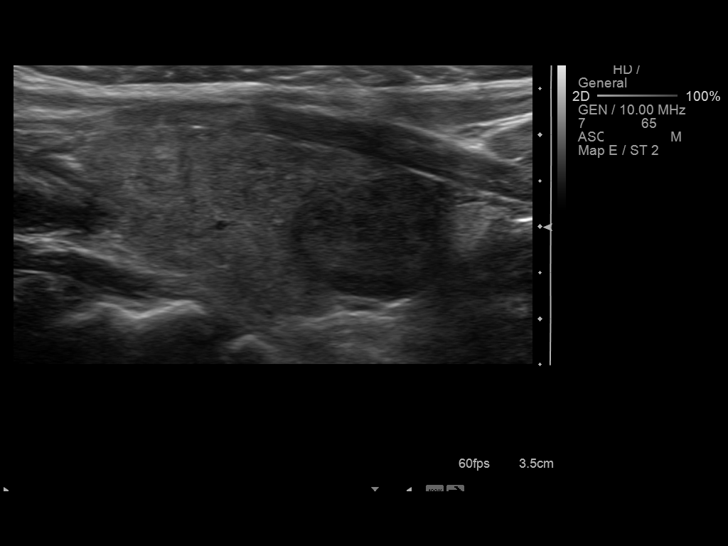
[im 5/52]
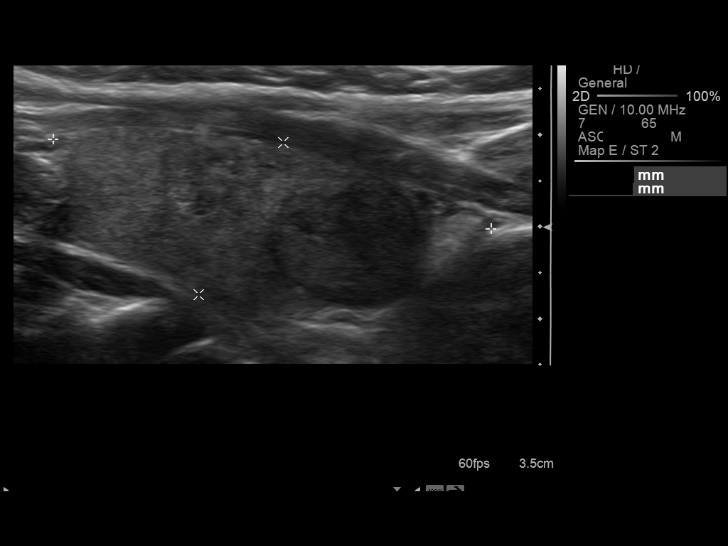
[im 9/52]
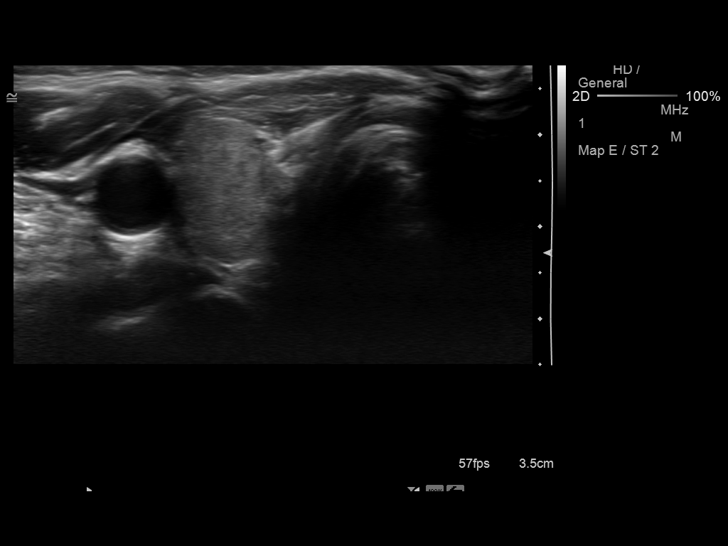
[im 13/52]
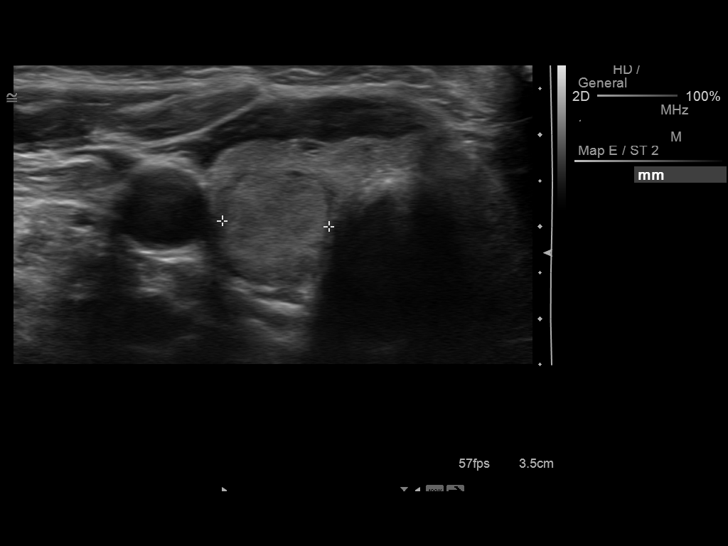
[im 18/52]
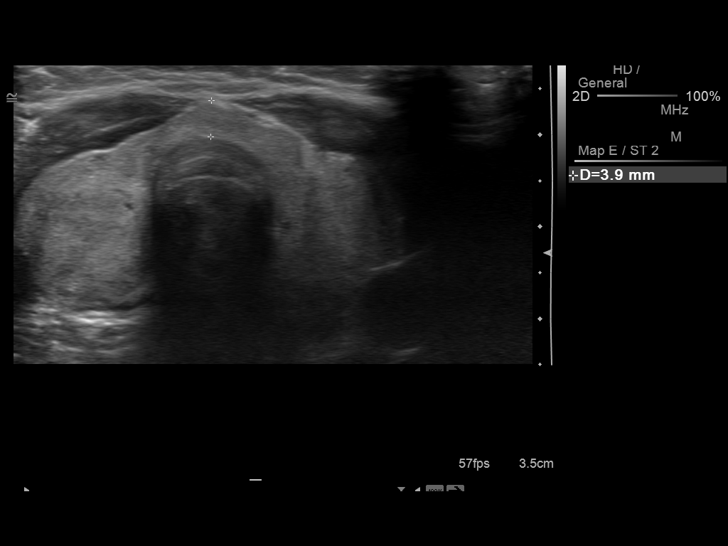
[im 20/52]
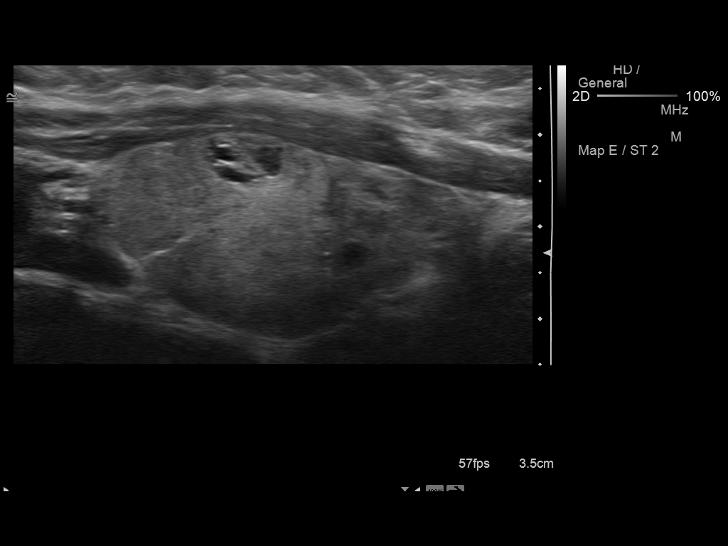
[im 24/52]
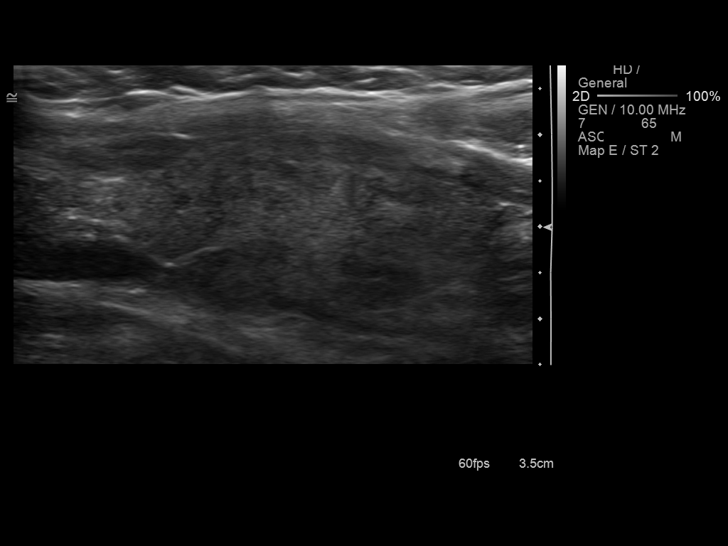
[im 28/52]
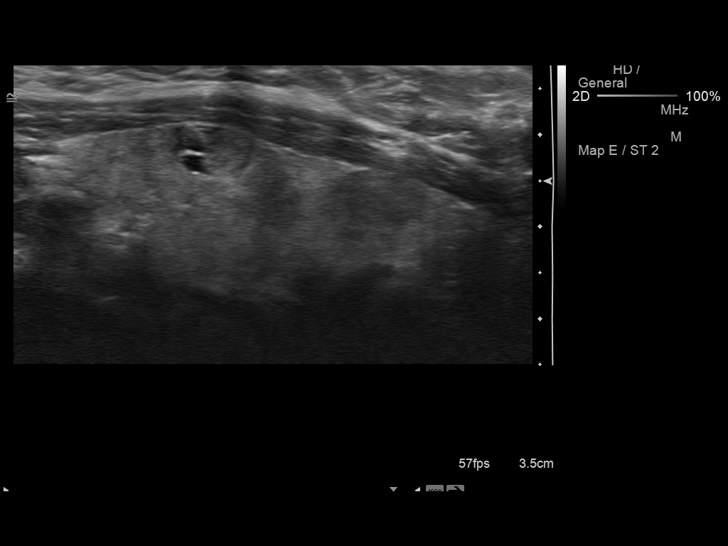
[im 32/52]
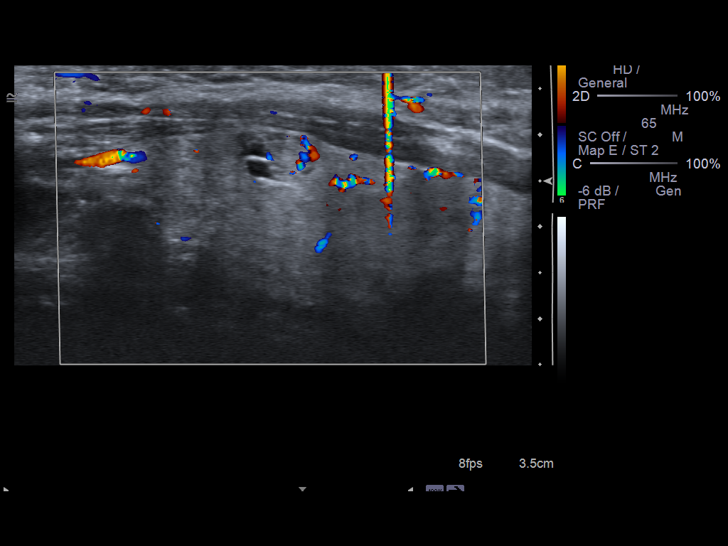
[im 35/52]
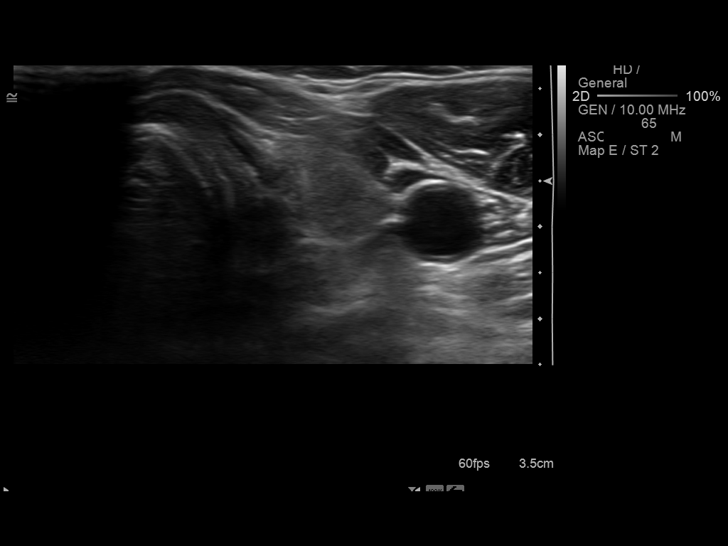
[im 39/52]
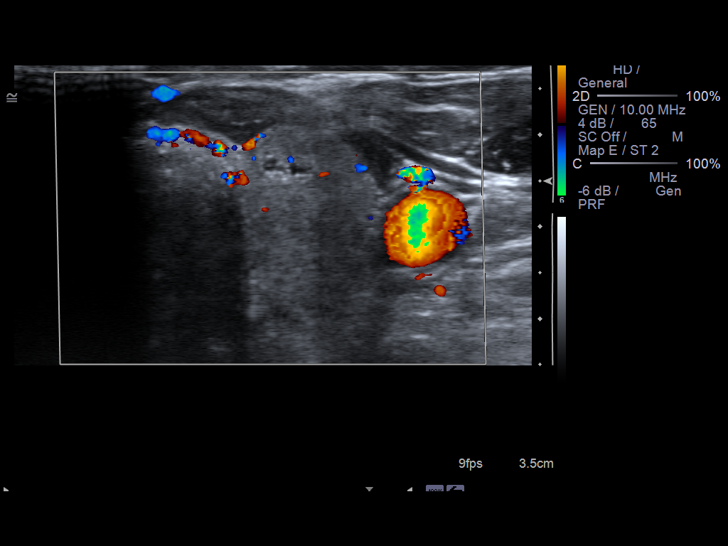
[im 43/52]
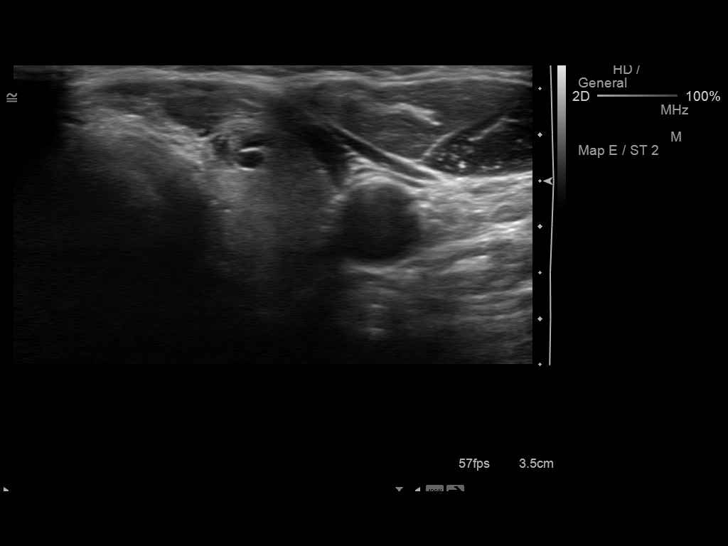
[im 47/52]
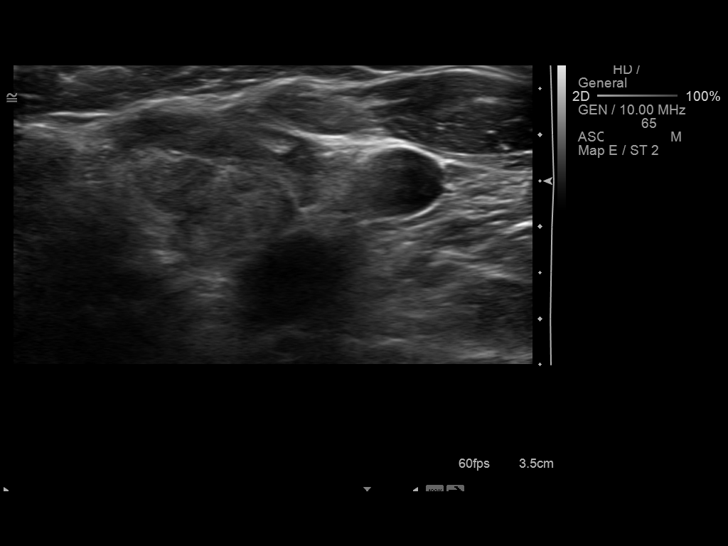
[im 52/52]
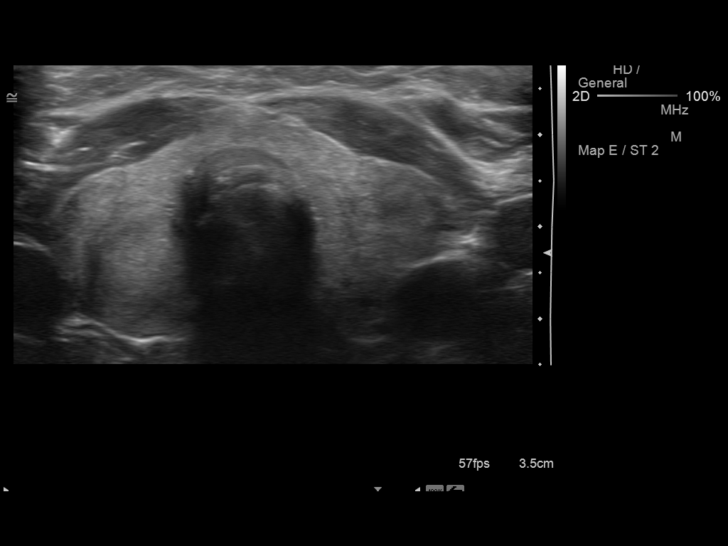

[14 of 25 positions shown; findings below may reference images not displayed]

FINDINGS: Right thyroid lobe:  The right lobe is slightly inhomogeneous
measuring 4.9 x 1.9 x 1.6 cm.
Left thyroid lobe:  The left lobe is slightly inhomogeneous
measuring 4.6 x 1.6 x 1.5 cm.
Isthmus:  The isthmus measures 4 mm in thickness.

Focal nodules:  Focal nodules are as follows:
A 1.6 x 1.6 x 1.2 cm nodule with internal vascular flow in the
lower right lobe.
A 0.9 x 0.6 x 0.8 cm nodule within the mid - upper left thyroid.
A 1.1 x 0.6 x 0.8 cm nodule with internal vascular flow in the
lower left thyroid lobe

Lymphadenopathy:  None visualized.
IMPRESSION: Three separate thyroid nodules as described.  Tissue sampling of
the 1.6 x 1.6 x 1.2 cm lower right thyroid nodule and possibly of
the 1.1 x 0.6 x 0.8 cm lower left thyroid nodule is recommended
given moderate internal vascular flow.

Upper limits of normal thyroid size.

## 2014-03-25 ENCOUNTER — Ambulatory Visit: Payer: Medicare Other | Admitting: Neurology

## 2014-04-01 ENCOUNTER — Ambulatory Visit: Payer: Medicare Other | Admitting: Neurology

## 2014-04-29 ENCOUNTER — Emergency Department (HOSPITAL_COMMUNITY)
Admission: EM | Admit: 2014-04-29 | Discharge: 2014-04-30 | Disposition: A | Discharge status: Home / Self Care | Payer: Medicare Other | Attending: Emergency Medicine | Admitting: Emergency Medicine

## 2014-04-29 ENCOUNTER — Emergency Department (HOSPITAL_COMMUNITY): Payer: Medicare Other

## 2014-04-29 ENCOUNTER — Encounter (HOSPITAL_COMMUNITY): Payer: Self-pay | Admitting: Emergency Medicine

## 2014-04-29 DIAGNOSIS — I10 Essential (primary) hypertension: Secondary | ICD-10-CM | POA: Diagnosis not present

## 2014-04-29 DIAGNOSIS — S199XXA Unspecified injury of neck, initial encounter: Secondary | ICD-10-CM | POA: Diagnosis present

## 2014-04-29 DIAGNOSIS — G4733 Obstructive sleep apnea (adult) (pediatric): Secondary | ICD-10-CM | POA: Diagnosis not present

## 2014-04-29 DIAGNOSIS — W19XXXA Unspecified fall, initial encounter: Secondary | ICD-10-CM

## 2014-04-29 DIAGNOSIS — Z8673 Personal history of transient ischemic attack (TIA), and cerebral infarction without residual deficits: Secondary | ICD-10-CM | POA: Diagnosis not present

## 2014-04-29 DIAGNOSIS — Z794 Long term (current) use of insulin: Secondary | ICD-10-CM | POA: Diagnosis not present

## 2014-04-29 DIAGNOSIS — Y9289 Other specified places as the place of occurrence of the external cause: Secondary | ICD-10-CM | POA: Insufficient documentation

## 2014-04-29 DIAGNOSIS — Z9981 Dependence on supplemental oxygen: Secondary | ICD-10-CM | POA: Diagnosis not present

## 2014-04-29 DIAGNOSIS — Y9389 Activity, other specified: Secondary | ICD-10-CM | POA: Diagnosis not present

## 2014-04-29 DIAGNOSIS — Z87891 Personal history of nicotine dependence: Secondary | ICD-10-CM | POA: Insufficient documentation

## 2014-04-29 DIAGNOSIS — S161XXA Strain of muscle, fascia and tendon at neck level, initial encounter: Secondary | ICD-10-CM

## 2014-04-29 DIAGNOSIS — E119 Type 2 diabetes mellitus without complications: Secondary | ICD-10-CM | POA: Diagnosis not present

## 2014-04-29 DIAGNOSIS — W010XXA Fall on same level from slipping, tripping and stumbling without subsequent striking against object, initial encounter: Secondary | ICD-10-CM | POA: Diagnosis not present

## 2014-04-29 DIAGNOSIS — Z79899 Other long term (current) drug therapy: Secondary | ICD-10-CM | POA: Diagnosis not present

## 2014-04-29 DIAGNOSIS — R200 Anesthesia of skin: Secondary | ICD-10-CM | POA: Insufficient documentation

## 2014-04-29 DIAGNOSIS — Z9889 Other specified postprocedural states: Secondary | ICD-10-CM | POA: Diagnosis not present

## 2014-04-29 DIAGNOSIS — Z7982 Long term (current) use of aspirin: Secondary | ICD-10-CM | POA: Diagnosis not present

## 2014-04-29 DIAGNOSIS — Z8739 Personal history of other diseases of the musculoskeletal system and connective tissue: Secondary | ICD-10-CM | POA: Diagnosis not present

## 2014-04-29 MED ORDER — HYDROCODONE-ACETAMINOPHEN 5-325 MG PO TABS
1.0000 | ORAL_TABLET | Freq: Once | ORAL | Status: AC
  Administered 2014-04-29: 1 via ORAL
  Filled 2014-04-29: qty 1
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
## 2014-04-29 NOTE — ED Notes (Signed)
Pt reports that she had a fall tonight (slipped on wet floor) and went to catch herself with her right arm, c/o pain and tingling in right arm.

## 2014-04-29 NOTE — ED Notes (Signed)
Pt presents via EMS with c/o fall that occurred at [facility]. Pt slipped on some water on the floor and fell onto her right side. Pt reports no pain to that area just tingling. Pt has a hx of stroke and wants to be evaluated for the same because of the tingling in her arm.

## 2014-04-30 MED ORDER — HYDROCODONE-ACETAMINOPHEN 5-325 MG PO TABS: 1.0000 | ORAL_TABLET | Freq: Once | ORAL | Status: DC

## 2014-04-30 NOTE — ED Provider Notes (Signed)
Medical screening examination/treatment/procedure(s) were performed by non-physician practitioner and as supervising physician I was immediately available for consultation/collaboration.     Mirna Mires, MD 04/30/14 [phone]

## 2014-04-30 NOTE — Discharge Instructions (Signed)
Cryotherapy °Cryotherapy is when you put ice on your injury. Ice helps lessen pain and puffiness (swelling) after an injury. Ice works the best when you start using it in the first 24 to 48 hours after an injury. °HOME CARE °· Put a dry or damp towel between the ice pack and your skin. °· You may press gently on the ice pack. °· Leave the ice on for no more than 10 to 20 minutes at a time. °· Check your skin after 5 minutes to make sure your skin is okay. °· Rest at least 20 minutes between ice pack uses. °· Stop using ice when your skin loses feeling (numbness). °· Do not use ice on someone who cannot tell you when it hurts. This includes small children and people with memory problems (dementia). °GET HELP RIGHT AWAY IF: °· You have white spots on your skin. °· Your skin turns blue or pale. °· Your skin feels waxy or hard. °· Your puffiness gets worse. °MAKE SURE YOU:  °· Understand these instructions. °· Will watch your condition. °· Will get help right away if you are not doing well or get worse. °Document Released: 12/07/2007 Document Revised: 09/12/2011 Document Reviewed: 02/10/2011 °[employer]® Patient Information ©2015 [employer]. This information is not intended to replace advice given to you by your health care provider. Make sure you discuss any questions you have with your health care provider. ° °Cervical Sprain °A cervical sprain is an injury in the neck in which the strong, fibrous tissues (ligaments) that connect your neck bones stretch or tear. Cervical sprains can range from mild to severe. Severe cervical sprains can cause the neck vertebrae to be unstable. This can lead to damage of the spinal cord and can result in serious nervous system problems. The amount of time it takes for a cervical sprain to get better depends on the cause and extent of the injury. Most cervical sprains heal in 1 to 3 weeks. °CAUSES  °Severe cervical sprains may be caused by:  °· Contact sport injuries (such as from  football, rugby, wrestling, hockey, auto racing, gymnastics, diving, martial arts, or boxing).   °· Motor vehicle collisions.   °· Whiplash injuries. This is an injury from a sudden forward and backward whipping movement of the head and neck.  °· Falls.   °Mild cervical sprains may be caused by:  °· Being in an awkward position, such as while cradling a telephone between your ear and shoulder.   °· Sitting in a chair that does not offer proper support.   °· Working at a poorly designed computer station.   °· Looking up or down for long periods of time.   °SYMPTOMS  °· Pain, soreness, stiffness, or a burning sensation in the front, back, or sides of the neck. This discomfort may develop immediately after the injury or slowly, 24 hours or more after the injury.   °· Pain or tenderness directly in the middle of the back of the neck.   °· Shoulder or upper back pain.   °· Limited ability to move the neck.   °· Headache.   °· Dizziness.   °· Weakness, numbness, or tingling in the hands or arms.   °· Muscle spasms.   °· Difficulty swallowing or chewing.   °· Tenderness and swelling of the neck.   °DIAGNOSIS  °Most of the time your health care provider can diagnose a cervical sprain by taking your history and doing a physical exam. Your health care provider will ask about previous neck injuries and any known neck problems, such as arthritis in the neck.   X-rays may be taken to find out if there are any other problems, such as with the bones of the neck. Other tests, such as a CT scan or MRI, may also be needed.  °TREATMENT  °Treatment depends on the severity of the cervical sprain. Mild sprains can be treated with rest, keeping the neck in place (immobilization), and pain medicines. Severe cervical sprains are immediately immobilized. Further treatment is done to help with pain, muscle spasms, and other symptoms and may include: °· Medicines, such as pain relievers, numbing medicines, or muscle relaxants.   °· Physical  therapy. This may involve stretching exercises, strengthening exercises, and posture training. Exercises and improved posture can help stabilize the neck, strengthen muscles, and help stop symptoms from returning.   °HOME CARE INSTRUCTIONS  °· Put ice on the injured area.   °¨ Put ice in a plastic bag.   °¨ Place a towel between your skin and the bag.   °¨ Leave the ice on for 15-20 minutes, 3-4 times a day.   °· If your injury was severe, you may have been given a cervical collar to wear. A cervical collar is a two-piece collar designed to keep your neck from moving while it heals. °¨ Do not remove the collar unless instructed by your health care provider. °¨ If you have long hair, keep it outside of the collar. °¨ Ask your health care provider before making any adjustments to your collar. Minor adjustments may be required over time to improve comfort and reduce pressure on your chin or on the back of your head. °¨ If you are allowed to remove the collar for cleaning or bathing, follow your health care provider's instructions on how to do so safely. °¨ Keep your collar clean by wiping it with mild soap and water and drying it completely. If the collar you have been given includes removable pads, remove them every 1-2 days and hand wash them with soap and water. Allow them to air dry. They should be completely dry before you wear them in the collar. °¨ If you are allowed to remove the collar for cleaning and bathing, wash and dry the skin of your neck. Check your skin for irritation or sores. If you see any, tell your health care provider. °¨ Do not drive while wearing the collar.   °· Only take over-the-counter or prescription medicines for pain, discomfort, or fever as directed by your health care provider.   °· Keep all follow-up appointments as directed by your health care provider.   °· Keep all physical therapy appointments as directed by your health care provider.   °· Make any needed adjustments to your  workstation to promote good posture.   °· Avoid positions and activities that make your symptoms worse.   °· Warm up and stretch before being active to help prevent problems.   °SEEK MEDICAL CARE IF:  °· Your pain is not controlled with medicine.   °· You are unable to decrease your pain medicine over time as planned.   °· Your activity level is not improving as expected.   °SEEK IMMEDIATE MEDICAL CARE IF:  °· You develop any bleeding. °· You develop stomach upset. °· You have signs of an allergic reaction to your medicine.   °· Your symptoms get worse.   °· You develop new, unexplained symptoms.   °· You have numbness, tingling, weakness, or paralysis in any part of your body.   °MAKE SURE YOU:  °· Understand these instructions. °· Will watch your condition. °· Will get help right away if you are not doing well or get worse. °  Document Released: 04/17/2007 Document Revised: 06/25/2013 Document Reviewed: 12/26/2012 [facility] Patient Information 2015 [place], [place]. This information is not intended to replace advice given to you by your health care provider. Make sure you discuss any questions you have with your health care provider.

## 2014-05-15 ENCOUNTER — Encounter: Payer: Self-pay | Admitting: Neurology

## 2014-05-15 ENCOUNTER — Ambulatory Visit (INDEPENDENT_AMBULATORY_CARE_PROVIDER_SITE_OTHER): Payer: Medicare Other | Admitting: Neurology

## 2014-05-15 ENCOUNTER — Encounter: Payer: Self-pay | Admitting: *Deleted

## 2014-05-15 VITALS — BP 153/95 | HR 73 | Ht 67.0 in | Wt 208.6 lb

## 2014-05-15 DIAGNOSIS — I6990 Unspecified sequelae of unspecified cerebrovascular disease: Secondary | ICD-10-CM

## 2014-05-15 MED ORDER — TOPIRAMATE 50 MG PO TABS: 50.0000 mg | ORAL_TABLET | Freq: Two times a day (BID) | ORAL | Status: DC

## 2014-05-15 NOTE — Progress Notes (Signed)
PATIENT: Sandy Davis DOB: [birth year]  REASON FOR VISIT: follow up for stroke HISTORY FROM: patient  HISTORY OF PRESENT ILLNESS: Sandy Davis is a 58 y.o. female who felt dizzy and weak and then fell down to the floor, and began to experience blurry vision, slurring of her speech and right sided weakness on 07/28/12. She was found to have acute infarcts of the left cerebellum and left inferior occipital lobe and a subacute lacunar infarct in the Superior Left Thalmus. Carotid Doppler showed no ICA stenosis. An incidental note of solid right thigh rhythm as noted. TEE was done and showed no thrombus, PFO, AST, and normal LVH. Hypercoagulable panel was negative for lupus anticoagulant and negative for factor V Leiden. 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 She was placed on Aspirin 325mg  for secondary stroke prevention. She completed 4 weeks of inpatient rehabilitation.   Date 12/06/2012: Patient comes in today for routine stroke followup. She states her speech has gotten better but still has mild Word finding trouble. And she states her short-term memory is still not back to baseline and she is very frustrated with not being able to remember where she puts things and things her children say that they've told her. She still has significant numbness and paresthesia in both her right arm and hand and right leg. She has been taking gabapentin 400 mg 4 times a day and states that it is not helping. She states her blood sugars have been hard to control, ranging from Lows in the 60s to high as in the 140s to 150s. She says she is due for lab work and hemoglobin A1c in July. She ambulates with a single pronged cane for stability. She reports no falls. Patient denies medication side effects, with no signs of bleeding. Patient expresses frustration many times during the visit that she is not able to do the things that she wants to do and is having  trouble with her memory.   UPDATE 02/05/13 (LL): Patient returns for follow up since last visit on 12/06/12. She reports that she increased the Gabapentin to 800 mg four times a day and thinks it helps some but she still has unpleasant pain and tingling in her right arm, hand, leg and foot. She states she is bothered by it more at night. She is using her cane less. She thinks her memory and general mood is a little better. She did not see any benefit from taking Venlafaxine for 4 weeks so she stopped taking it. She has been taking Aspirin 325 mg daily. Patient denies medication side effects, with no signs of bleeding or excessive bruising.   UPDATE 03/19/13 (LL): Patient returns for 6 week follow up after adding Cymbalta for her neuropathic pain and paresthesias. She states that it really does help and it helps with her mood which she likes too. She no longer needs to use a cane. Her BP is under good control and her blood sugars are better controlled now too, highs in the 130-140 range. Sleep study is scheduled for next week with Dr. Rexene Davis.   UPDATE 09/18/13 (LL):  Sandy Davis returns for 6 month stroke follow up. She had a sleep study in our office and she is now on treatment with CPAP of 11 cm of water pressure. She reports that her paresthesias and allodynia on the right arm , hand, and leg are the same, tolerable with Gabapentin, 1200 mg 3-4 times daily.  Depression  is stable on Cymbalta.  BP is well controlled, it is 129/88 in office today.  She states her blood sugars have been up and down, hard to control. She is working with her PCP on this.  No new complaints. Update 05/15/2014 : she returns for follow-up after last visit in March 2015. She had a unfortunate fall on 04/29/14 and has had severe exacerbation of her right upper extremity pain and paresthesias. She is currently on gabapentin 1200 mg 4 times daily which was previously helping a lot more but now the pain is much increased. She was seen in  the emergency room and underwent x-rays of the neck and shoulders which were fine. She is doing well from a stroke standpoint without recurrent stroke or TIA symptoms. She states her blood pressure is very well controlled. She remains on aspirin and she is tolerating well. She states her sugars have also been under good control. REVIEW OF SYSTEMS: Full 14 system review of systems performed and notable only for:   fatigue, light sensitivity, shortness of breath, incontinence of bladder, memory loss, numbness, pain, depression, nervousness anxiety and apnea  ALLERGIES: No Known Allergies  HOME MEDICATIONS: Outpatient Prescriptions Prior to Visit  Medication Sig Dispense Refill  . amLODipine (NORVASC) 10 MG tablet Take 10 mg by mouth daily.    Marland Kitchen aspirin 325 MG tablet Take 1 tablet (325 mg total) by mouth daily.    . clobetasol ointment (TEMOVATE) 0.86 % Apply 1 application topically 2 (two) times daily as needed (dry skin on fingers).     . DULoxetine (CYMBALTA) 60 MG capsule Take 1 capsule (60 mg total) by mouth daily. 90 capsule 3  . gabapentin (NEURONTIN) 400 MG capsule Take 3 capsules (1,200 mg total) by mouth 4 (four) times daily. 360 capsule 3  . HYDROcodone-acetaminophen (NORCO/VICODIN) 5-325 MG per tablet Take 1 tablet by mouth once. 14 tablet 0  . LEVEMIR FLEXTOUCH 100 UNIT/ML Pen Inject 20 Units into the skin 2 (two) times daily.   5  . lisinopril (PRINIVIL,ZESTRIL) 10 MG tablet Take 10 mg by mouth daily.     . simvastatin (ZOCOR) 40 MG tablet Take 40 mg by mouth every morning.     Marland Kitchen doxazosin (CARDURA) 2 MG tablet     . insulin detemir (LEVEMIR) 100 UNIT/ML injection Inject 20 Units into the skin 2 (two) times daily.     No facility-administered medications prior to visit.     PHYSICAL EXAM  Filed Vitals:   05/15/14 1118  BP: 153/95  Pulse: 73  Height: 5\' 7"  (1.702 m)  Weight: 208 lb 9.6 oz (94.62 kg)   Body mass index is 32.66 kg/(m^2).  Generalized:  Middle aged african  american lady In no acute distress  Neck: Supple, no carotid bruits  Cardiac: Regular rate rhythm, soft 2/6 murmur  Pulmonary: Clear to auscultation bilaterally  Musculoskeletal: No deformity   NEUROLOGIC:  MENTAL STATUS: awake, alert and oriented to person, place and time, MMSE not tested today.    CRANIAL NERVE: pupils equal and reactive to light, visual fields full to confrontation, extraocular muscles intact, no nystagmus, facial sensation DECREASED ON RIGHT and strength symmetric, uvula midline, shoulder shrug symmetric, tongue midline.  MOTOR: normal bulk and tone, DECREASED ON RUE AND RLE 4/5, LEFT 5/5, decreased fine finger movements on RIGHT  SENSORY:  Slight decreased to light touch on RUE, RLE, R FACE, ALLODYNIA ON RUE/RLE  COORDINATION: finger-nose-finger normal, heel-shin normal  REFLEXES: deep tendon reflexes present and symmetric 2+  GAIT/STATION: narrow based gait; able to walk on toes, heels. Mild difficulty with Tandem romberg is negative.    ASSESSMENT AND PLAN Ms. Reichow is a 58 year old AA female here for follow up of infarcts of the left cerebellum and left inferior occipital lobe and a subacute lacunar infarct in the Superior Left Thalmus on 07/28/12. Her vascular risk factors are diabetes, hyperlipidemia, history microscopic polyangiitis, sleep apnea and obesity. Her post stroke paresthesias and allodynia on her right hand and leg are now intolerable with i Gabapentin; after a recent fall Depression is stable on Cymbalta.  PLAN: I had a long discussion the patient with regards to her right upper extremity post stroke paresthesias and pain and recommend a trial of Topamax 50 mg daily for 3 days increase as tolerated without side effects to twice daily. Continue gabapentin in the current dose of 1200 mg 4 times daily but may consider tapering and reducing it if Topamax is effective. Continue Aspirin for stroke prevention and maintenance to control of hypertension with blood  pressure goal below 130/90 and lipids with LDL cholesterol goal below 100 mg percent. Check screening carotid ultrasound study. Return for follow-up in 3 months with nurse practitioner or call earlier if necessary .  Antony Contras, MD  05/15/2014, 12:04 PM [facility] [address] [place], [place] [address] [phone]  Note: This document was prepared with digital dictation and possible smart phrase technology. Any transcriptional errors that result from this process are unintentional.

## 2014-05-15 NOTE — Patient Instructions (Signed)
I had a long discussion the patient with regards to her right upper extremity post stroke paresthesias and pain and recommend a trial of Topamax 50 mg daily for 3 days increase as tolerated without side effects to twice daily. Continue gabapentin in the current dose of 1200 mg 4 times daily but may consider tapering and reducing it if Topamax is effective. Continue aspirin for stroke prevention and maintenance to control of hypertension with blood pressure goal below 130/90 and lipids with LDL cholesterol goal below 100 mg percent. Check screening carotid ultrasound study. Return for follow-up in 3 months with nurse practitioner or call earlier if necessary

## 2014-06-03 ENCOUNTER — Other Ambulatory Visit: Payer: Self-pay | Admitting: Nurse Practitioner

## 2014-06-05 ENCOUNTER — Telehealth: Payer: Self-pay | Admitting: Neurology

## 2014-06-05 NOTE — Telephone Encounter (Signed)
Patient is calling. Patient started taking Toprimate on 05-15-14 and the medication made patient feel like her body is slowing down. She stopped the medication on 05-29-14  and patient is back to normal. Can another Rx be called in? [place]. Please call patient and advise. It is ok to leave a message. Thank you.

## 2014-06-07 MED ORDER — PREGABALIN 50 MG PO CAPS: 50.0000 mg | ORAL_CAPSULE | Freq: Two times a day (BID) | ORAL | Status: DC

## 2014-06-07 NOTE — Telephone Encounter (Signed)
Rx has been entered/phoned in.  I called the patient back.  Relayed Dr Clydene Fake message.  She verbalized understanding and will call us back with an update.

## 2014-06-07 NOTE — Telephone Encounter (Signed)
Agree with DC Topamax. Try Lyrica 50 mg twice daily and call back in 1 week

## 2014-06-12 ENCOUNTER — Ambulatory Visit (INDEPENDENT_AMBULATORY_CARE_PROVIDER_SITE_OTHER): Payer: Medicare Other

## 2014-06-12 DIAGNOSIS — I6990 Unspecified sequelae of unspecified cerebrovascular disease: Secondary | ICD-10-CM

## 2014-06-24 ENCOUNTER — Ambulatory Visit: Payer: Medicare Other | Admitting: Neurology

## 2014-06-24 ENCOUNTER — Telehealth: Payer: Self-pay | Admitting: Neurology

## 2014-06-24 NOTE — Telephone Encounter (Signed)
Patient is a no show for today's appointment 9:00-12/22/15

## 2014-06-25 ENCOUNTER — Encounter: Payer: Self-pay | Admitting: Neurology

## 2014-06-30 ENCOUNTER — Ambulatory Visit: Payer: Medicare Other | Admitting: Neurology

## 2014-07-06 ENCOUNTER — Other Ambulatory Visit: Payer: Self-pay | Admitting: Neurology

## 2014-07-08 NOTE — Telephone Encounter (Signed)
Per phone note from 12/03

## 2014-08-11 ENCOUNTER — Telehealth: Payer: Self-pay

## 2014-08-11 NOTE — Telephone Encounter (Signed)
Spoke to daughter. R/s appt due to change in provider schedule.

## 2014-08-18 ENCOUNTER — Ambulatory Visit: Payer: Medicare Other | Admitting: Nurse Practitioner

## 2014-08-25 ENCOUNTER — Ambulatory Visit: Payer: Medicaid Other | Admitting: Nurse Practitioner

## 2014-08-26 ENCOUNTER — Encounter: Payer: Self-pay | Admitting: Nurse Practitioner

## 2014-08-28 ENCOUNTER — Encounter: Payer: Self-pay | Admitting: Neurology

## 2014-10-30 ENCOUNTER — Ambulatory Visit (INDEPENDENT_AMBULATORY_CARE_PROVIDER_SITE_OTHER): Payer: Medicare Other | Admitting: Neurology

## 2014-10-30 ENCOUNTER — Encounter: Payer: Self-pay | Admitting: Neurology

## 2014-10-30 VITALS — BP 121/71 | HR 72 | Wt 204.0 lb

## 2014-10-30 DIAGNOSIS — M792 Neuralgia and neuritis, unspecified: Secondary | ICD-10-CM | POA: Diagnosis not present

## 2014-10-30 DIAGNOSIS — G890 Central pain syndrome: Secondary | ICD-10-CM | POA: Insufficient documentation

## 2014-10-30 NOTE — Progress Notes (Signed)
PATIENT: Sandy Davis DOB: [birth year]  REASON FOR VISIT: follow up for stroke HISTORY FROM: patient  HISTORY OF PRESENT ILLNESS: Sandy Davis is a 59 y.o. female who felt dizzy and weak and then fell down to the floor, and began to experience blurry vision, slurring of her speech and right sided weakness on 07/28/12. She was found to have acute infarcts of the left cerebellum and left inferior occipital lobe and a subacute lacunar infarct in the Superior Left Thalmus. Carotid Doppler showed no ICA stenosis. An incidental note of solid right thigh rhythm as noted. TEE was done and showed no thrombus, PFO, AST, and normal LVH. Hypercoagulable panel was negative for lupus anticoagulant and negative for factor V Leiden. 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 She was placed on Aspirin 325mg  for secondary stroke prevention. She completed 4 weeks of inpatient rehabilitation.   Date 12/06/2012: Patient comes in today for routine stroke followup. She states her speech has gotten better but still has mild Word finding trouble. And she states her short-term memory is still not back to baseline and she is very frustrated with not being able to remember where she puts things and things her children say that they've told her. She still has significant numbness and paresthesia in both her right arm and hand and right leg. She has been taking gabapentin 400 mg 4 times a day and states that it is not helping. She states her blood sugars have been hard to control, ranging from Lows in the 60s to high as in the 140s to 150s. She says she is due for lab work and hemoglobin A1c in July. She ambulates with a single pronged cane for stability. She reports no falls. Patient denies medication side effects, with no signs of bleeding. Patient expresses frustration many times during the visit that she is not able to do the things that she wants to do and is having  trouble with her memory.   UPDATE 02/05/13 (LL): Patient returns for follow up since last visit on 12/06/12. She reports that she increased the Gabapentin to 800 mg four times a day and thinks it helps some but she still has unpleasant pain and tingling in her right arm, hand, leg and foot. She states she is bothered by it more at night. She is using her cane less. She thinks her memory and general mood is a little better. She did not see any benefit from taking Venlafaxine for 4 weeks so she stopped taking it. She has been taking Aspirin 325 mg daily. Patient denies medication side effects, with no signs of bleeding or excessive bruising.   UPDATE 03/19/13 (LL): Patient returns for 6 week follow up after adding Cymbalta for her neuropathic pain and paresthesias. She states that it really does help and it helps with her mood which she likes too. She no longer needs to use a cane. Her BP is under good control and her blood sugars are better controlled now too, highs in the 130-140 range. Sleep study is scheduled for next week with Dr. Rexene Alberts.   UPDATE 09/18/13 (LL):  [name] returns for 6 month stroke follow up. She had a sleep study in our office and she is now on treatment with CPAP of 11 cm of water pressure. She reports that her paresthesias and allodynia on the right arm , hand, and leg are the same, tolerable with Gabapentin, 1200 mg 3-4 times daily.  Depression  is stable on Cymbalta.  BP is well controlled, it is 129/88 in office today.  She states her blood sugars have been up and down, hard to control. She is working with her PCP on this.  No new complaints. Update 05/15/2014 : she returns for follow-up after last visit in March 2015. She had a unfortunate fall on 04/29/14 and has had severe exacerbation of her right upper extremity pain and paresthesias. She is currently on gabapentin 1200 mg 4 times daily which was previously helping a lot more but now the pain is much increased. She was seen in  the emergency room and underwent x-rays of the neck and shoulders which were fine. She is doing well from a stroke standpoint without recurrent stroke or TIA symptoms. She states her blood pressure is very well controlled. She remains on aspirin and she is tolerating well. She states her sugars have also been under good control. Update 10/30/2014 : She returns for follow-up after last visit 6 months ago. She continues to complain of severe right-sided pain and paresthesias particularly in the upper extremity and to lesser extent in the right leg as well. She is currently on gabapentin 1200 mg 4 times daily and Lyrica 50 twice daily but the pain is barely tolerable. She is also on Cymbalta 60 mg daily but that is for depression. At last visit I had tried Topamax but she stopped it as it did not help. She states her sugars are yet poorly controlled but her blood pressure is well controlled. She feels the right-sided posterior pain is quite disabling and she would like referral to a pain clinic if it'll help. She did not undergo follow-up carotid Dopplers which I had ordered at last visit REVIEW OF SYSTEMS: Full 14 system review of systems performed and notable only for:   Light sensitivity, hearing loss, apnea, dizziness, numbness, weakness, pain, agitation, confusion, depression, rash, moles and all the systems negative ALLERGIES: No Known Allergies  HOME MEDICATIONS: Outpatient Prescriptions Prior to Visit  Medication Sig Dispense Refill  . amLODipine (NORVASC) 10 MG tablet Take 10 mg by mouth daily.    Marland Kitchen aspirin 325 MG tablet Take 1 tablet (325 mg total) by mouth daily.    . clobetasol ointment (TEMOVATE) 9.48 % Apply 1 application topically 2 (two) times daily as needed (dry skin on fingers).     . DULoxetine (CYMBALTA) 60 MG capsule Take 1 capsule (60 mg total) by mouth daily. 90 capsule 3  . gabapentin (NEURONTIN) 400 MG capsule Take 3 capsules (1,200 mg total) by mouth 4 (four) times daily. 360  capsule 3  . HYDROcodone-acetaminophen (NORCO/VICODIN) 5-325 MG per tablet Take 1 tablet by mouth once. 14 tablet 0  . LEVEMIR FLEXTOUCH 100 UNIT/ML Pen Inject 20 Units into the skin 2 (two) times daily.   5  . lisinopril (PRINIVIL,ZESTRIL) 10 MG tablet Take 10 mg by mouth daily.     Marland Kitchen LYRICA 50 MG capsule TAKE ONE CAPSULE BY MOUTH TWICE DAILY 60 capsule 0  . DULoxetine (CYMBALTA) 60 MG capsule TAKE ONE CAPSULE BY MOUTH DAILY 90 capsule 1  . simvastatin (ZOCOR) 40 MG tablet Take 40 mg by mouth every morning.     . topiramate (TOPAMAX) 50 MG tablet Take 1 tablet (50 mg total) by mouth 2 (two) times daily. Start one tablet daily x 3 days and increase to twice daily as tolerated 60 tablet 3   No facility-administered medications prior to visit.     PHYSICAL EXAM  Filed Vitals:   10/30/14 1258  BP: 121/71  Pulse: 72  Weight: 204 lb (92.534 kg)   Body mass index is 31.94 kg/(m^2).  Generalized:  Middle aged african american lady In no acute distress  Neck: Supple, no carotid bruits  Cardiac: Regular rate rhythm, soft 2/6 murmur  Pulmonary: Clear to auscultation bilaterally  Musculoskeletal: No deformity   NEUROLOGIC:  MENTAL STATUS: awake, alert and oriented to person, place and time, MMSE not tested today.    CRANIAL NERVE: pupils equal and reactive to light, visual fields full to confrontation, extraocular muscles intact, no nystagmus, facial sensation DECREASED ON RIGHT and strength symmetric, uvula midline, shoulder shrug symmetric, tongue midline.  MOTOR: normal bulk and tone, DECREASED ON RUE AND RLE 4/5, LEFT 5/5, decreased fine finger movements on RIGHT  SENSORY:  Slight decreased to light touch on RUE, RLE, R FACE, ALLODYNIA ON RUE/RLE  COORDINATION: finger-nose-finger normal, heel-shin normal  REFLEXES: deep tendon reflexes present and symmetric 2+  GAIT/STATION: narrow based gait; able to walk on toes, heels. Mild difficulty with Tandem romberg is negative.    ASSESSMENT  AND PLAN Ms. Eskenazi is a 59 year old AA female here for follow up of infarcts of the left cerebellum and left inferior occipital lobe and a subacute lacunar infarct in the Superior Left Thalmus on 07/28/12. Her vascular risk factors are diabetes, hyperlipidemia,   sleep apnea and obesity. Her post stroke paresthesias and allodynia on her right hand and leg are now intolerable despite being on Gabapentin; and Lyrica . Depression is stable on Cymbalta.  PLAN: I had a long d/w patient about his recent stroke, risk for recurrent stroke/TIAs, personally independently reviewed imaging studies and stroke evaluation results and answered questions.Continue aspirin 81 mg orally every day  for secondary stroke prevention and maintain strict control of hypertension with blood pressure goal below 130/90, diabetes with hemoglobin A1c goal below 6.5% and lipids with LDL cholesterol goal below 100 mg/dL. I also advised the patient to eat a healthy diet with plenty of whole grains, cereals, fruits and vegetables, exercise regularly and maintain ideal body weight. Continue gabapentin, Lyrica and Cymbalta for her neuropathic right sided post thalamic pain. Referred to Dr. Neomia Dear for sympathetic nerve block for her pain. Hopefully if this is effective may consider reducing her neuropathic pain medications Followup in the future with [place] practitioner in 6 months or call earlier if necessary  Antony Contras, MD  10/30/2014, 4:49 PM [facility] [address] [place], [place] [address] [phone]  Note: This document was prepared with digital dictation and possible smart phrase technology. Any transcriptional errors that result from this process are unintentional.

## 2014-10-30 NOTE — Patient Instructions (Addendum)
I had a long d/w patient about his recent stroke, risk for recurrent stroke/TIAs, personally independently reviewed imaging studies and stroke evaluation results and answered questions.Continue aspirin 81 mg orally every day  for secondary stroke prevention and maintain strict control of hypertension with blood pressure goal below 130/90, diabetes with hemoglobin A1c goal below 6.5% and lipids with LDL cholesterol goal below 100 mg/dL. I also advised the patient to eat a healthy diet with plenty of whole grains, cereals, fruits and vegetables, exercise regularly and maintain ideal body weight. Continue gabapentin, Lyrica and Cymbalta for her neuropathic right sided post thalamic pain. Referred to Dr. Neomia Dear for sympathetic nerve block for her pain. Hopefully if this is effective may consider reducing her neuropathic pain medications Followup in the future with [place] practitioner in 6 months or call earlier if necessary

## 2014-12-08 ENCOUNTER — Encounter: Payer: Self-pay | Admitting: Neurology

## 2014-12-08 ENCOUNTER — Ambulatory Visit (INDEPENDENT_AMBULATORY_CARE_PROVIDER_SITE_OTHER): Payer: Self-pay | Admitting: Neurology

## 2014-12-08 ENCOUNTER — Ambulatory Visit (INDEPENDENT_AMBULATORY_CARE_PROVIDER_SITE_OTHER): Payer: Medicare Other | Admitting: Neurology

## 2014-12-08 DIAGNOSIS — R202 Paresthesia of skin: Secondary | ICD-10-CM

## 2014-12-08 DIAGNOSIS — R209 Unspecified disturbances of skin sensation: Secondary | ICD-10-CM

## 2014-12-08 DIAGNOSIS — R208 Other disturbances of skin sensation: Secondary | ICD-10-CM | POA: Diagnosis not present

## 2014-12-08 DIAGNOSIS — I69998 Other sequelae following unspecified cerebrovascular disease: Secondary | ICD-10-CM

## 2014-12-08 DIAGNOSIS — I69898 Other sequelae of other cerebrovascular disease: Secondary | ICD-10-CM

## 2014-12-08 NOTE — Progress Notes (Signed)
Please refer to EMG and nerve conduction study procedure note. 

## 2014-12-08 NOTE — Procedures (Signed)
     HISTORY:  Sandy Davis is a 59 year old patient with a history of right-sided numbness, tingling and dysesthesias over the last 2 years following a stroke event. The patient is being evaluated for her ongoing right arm and leg pain.  NERVE CONDUCTION STUDIES:  Nerve conduction studies were performed on the right upper extremity. The distal motor latencies and motor amplitudes for the median and ulnar nerves were within normal limits. The F wave latencies and nerve conduction velocities for these nerves were also normal. The sensory latencies for the median and ulnar nerves were normal.  Nerve conduction studies were performed on the right lower extremity. The distal motor latencies and motor amplitudes for the peroneal and posterior tibial nerves were within normal limits. The nerve conduction velocities for these nerves were also normal. The H reflex latency was normal. The sensory latency for the peroneal nerve was within normal limits.   EMG STUDIES:  EMG study was performed on the right upper extremity:  The first dorsal interosseous muscle reveals 2 to 4 K units with full recruitment. No fibrillations or positive waves were noted. The abductor pollicis brevis muscle reveals 2 to 4 K units with full recruitment. No fibrillations or positive waves were noted. The extensor indicis proprius muscle reveals 1 to 3 K units with full recruitment. No fibrillations or positive waves were noted. The pronator teres muscle reveals 2 to 3 K units with full recruitment. No fibrillations or positive waves were noted. The biceps muscle reveals 1 to 2 K units with full recruitment. No fibrillations or positive waves were noted. The triceps muscle reveals 2 to 4 K units with full recruitment. No fibrillations or positive waves were noted. The anterior deltoid muscle reveals 2 to 3 K units with full recruitment. No fibrillations or positive waves were noted. The cervical paraspinal muscles were  tested at 2 levels. No abnormalities of insertional activity were seen at either level tested. There was good relaxation.  EMG study was performed on the right lower extremity:  The tibialis anterior muscle reveals 2 to 4K motor units with full recruitment. No fibrillations or positive waves were seen. The peroneus tertius muscle reveals 2 to 4K motor units with full recruitment. No fibrillations or positive waves were seen. The medial gastrocnemius muscle reveals 1 to 3K motor units with full recruitment. No fibrillations or positive waves were seen. The vastus lateralis muscle reveals 2 to 4K motor units with full recruitment. No fibrillations or positive waves were seen. The iliopsoas muscle reveals 2 to 4K motor units with full recruitment. No fibrillations or positive waves were seen. The biceps femoris muscle (long head) reveals 2 to 4K motor units with full recruitment. No fibrillations or positive waves were seen. The lumbosacral paraspinal muscles were tested at 3 levels, and revealed no abnormalities of insertional activity at all 3 levels tested. There was good relaxation.   IMPRESSION:  Nerve conduction studies done on the right upper and right lower extremities were within normal limits. No evidence of a neuropathy is seen. EMG of the right upper extremity and right lower extremity were unremarkable, without evidence of an overlying radiculopathy.  Jill Alexanders MD 12/08/2014 10:59 AM  [facility] [address] [place], [place] [address]  Phone [phone] Fax [phone]

## 2014-12-15 ENCOUNTER — Telehealth: Payer: Self-pay | Admitting: Neurology

## 2014-12-15 MED ORDER — GABAPENTIN 400 MG PO CAPS: 1200.0000 mg | ORAL_CAPSULE | Freq: Four times a day (QID) | ORAL | Status: DC

## 2014-12-15 NOTE — Telephone Encounter (Signed)
Patient called and requested a refill on Rx. gabapentin (NEURONTIN) 400 MG capsule. Please call and advise.

## 2014-12-15 NOTE — Telephone Encounter (Signed)
Rx has been sent.  I called back to advise.  Patient is aware.

## 2015-02-09 ENCOUNTER — Other Ambulatory Visit: Payer: Self-pay | Admitting: Neurology

## 2015-02-18 DIAGNOSIS — I10 Essential (primary) hypertension: Secondary | ICD-10-CM | POA: Diagnosis not present

## 2015-02-18 DIAGNOSIS — I639 Cerebral infarction, unspecified: Secondary | ICD-10-CM | POA: Diagnosis not present

## 2015-02-18 DIAGNOSIS — E782 Mixed hyperlipidemia: Secondary | ICD-10-CM | POA: Diagnosis not present

## 2015-02-18 DIAGNOSIS — E0800 Diabetes mellitus due to underlying condition with hyperosmolarity without nonketotic hyperglycemic-hyperosmolar coma (NKHHC): Secondary | ICD-10-CM | POA: Diagnosis not present

## 2015-04-13 ENCOUNTER — Ambulatory Visit (INDEPENDENT_AMBULATORY_CARE_PROVIDER_SITE_OTHER): Payer: Medicare Other | Admitting: Adult Health

## 2015-04-13 ENCOUNTER — Encounter: Payer: Self-pay | Admitting: Adult Health

## 2015-04-13 VITALS — BP 126/84 | HR 93 | Ht 67.0 in | Wt 206.0 lb

## 2015-04-13 DIAGNOSIS — M792 Neuralgia and neuritis, unspecified: Secondary | ICD-10-CM

## 2015-04-13 DIAGNOSIS — Z8673 Personal history of transient ischemic attack (TIA), and cerebral infarction without residual deficits: Secondary | ICD-10-CM
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
## 2015-04-13 NOTE — Patient Instructions (Signed)
Continue on aspirin for stroke prevention Blood pressure goal less than 130/90 Diabetes hemoglobin A1c less than 6.5% LDL less than 100 If you have any stroke like symptoms please call 911 immediately. Continue to follow with Dr. Ace Gins for pain.

## 2015-04-14 NOTE — Progress Notes (Signed)
I agree with the above plan 

## 2015-05-01 ENCOUNTER — Ambulatory Visit: Payer: Medicare Other | Admitting: Adult Health

## 2015-05-26 DIAGNOSIS — E119 Type 2 diabetes mellitus without complications: Secondary | ICD-10-CM | POA: Diagnosis not present

## 2015-05-26 DIAGNOSIS — G4733 Obstructive sleep apnea (adult) (pediatric): Secondary | ICD-10-CM | POA: Diagnosis not present

## 2015-05-26 DIAGNOSIS — F4323 Adjustment disorder with mixed anxiety and depressed mood: Secondary | ICD-10-CM | POA: Diagnosis not present

## 2015-05-26 DIAGNOSIS — M2011 Hallux valgus (acquired), right foot: Secondary | ICD-10-CM | POA: Diagnosis not present

## 2015-06-07 ENCOUNTER — Other Ambulatory Visit: Payer: Self-pay | Admitting: Neurology

## 2015-06-08 NOTE — Telephone Encounter (Signed)
Last OV note says: She states that she is seeing Dr. Ace Gins and she is trying to wean her off of gabapentin.

## 2015-06-09 ENCOUNTER — Other Ambulatory Visit: Payer: Self-pay | Admitting: Neurology

## 2015-07-01 DIAGNOSIS — E119 Type 2 diabetes mellitus without complications: Secondary | ICD-10-CM | POA: Diagnosis not present

## 2015-09-02 HISTORY — PX: OTHER SURGICAL HISTORY: SHX169

## 2015-09-03 DIAGNOSIS — I10 Essential (primary) hypertension: Secondary | ICD-10-CM | POA: Diagnosis not present

## 2015-09-03 DIAGNOSIS — G4733 Obstructive sleep apnea (adult) (pediatric): Secondary | ICD-10-CM | POA: Diagnosis not present

## 2015-09-03 DIAGNOSIS — E039 Hypothyroidism, unspecified: Secondary | ICD-10-CM | POA: Diagnosis not present

## 2015-09-03 DIAGNOSIS — E119 Type 2 diabetes mellitus without complications: Secondary | ICD-10-CM | POA: Diagnosis not present

## 2015-10-12 ENCOUNTER — Encounter: Payer: Self-pay | Admitting: Adult Health

## 2015-10-12 ENCOUNTER — Ambulatory Visit (INDEPENDENT_AMBULATORY_CARE_PROVIDER_SITE_OTHER): Payer: Medicare Other | Admitting: Adult Health

## 2015-10-12 VITALS — BP 127/85 | HR 75 | Ht 67.0 in | Wt 202.6 lb

## 2015-10-12 DIAGNOSIS — M792 Neuralgia and neuritis, unspecified: Secondary | ICD-10-CM | POA: Diagnosis not present

## 2015-10-12 DIAGNOSIS — Z8673 Personal history of transient ischemic attack (TIA), and cerebral infarction without residual deficits: Secondary | ICD-10-CM
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
## 2015-10-12 NOTE — Progress Notes (Signed)
I agree with the above plan 

## 2015-10-12 NOTE — Patient Instructions (Signed)
Continue gabapentin - try only taking 3 times a day Continue Aspirin  Blood pressure <130/90 Cholesterol LDL <100 If your symptoms worsen or you develop new symptoms please let us know.

## 2015-10-14 ENCOUNTER — Other Ambulatory Visit: Payer: Self-pay | Admitting: Neurology

## 2015-12-07 ENCOUNTER — Other Ambulatory Visit: Payer: Self-pay | Admitting: Neurology

## 2015-12-08 ENCOUNTER — Other Ambulatory Visit: Payer: Self-pay

## 2015-12-08 MED ORDER — DULOXETINE HCL 60 MG PO CPEP: 60.0000 mg | ORAL_CAPSULE | Freq: Every day | ORAL | Status: DC

## 2016-01-01 DIAGNOSIS — G4700 Insomnia, unspecified: Secondary | ICD-10-CM | POA: Diagnosis not present

## 2016-01-01 DIAGNOSIS — E782 Mixed hyperlipidemia: Secondary | ICD-10-CM | POA: Diagnosis not present

## 2016-01-01 DIAGNOSIS — Z79899 Other long term (current) drug therapy: Secondary | ICD-10-CM | POA: Diagnosis not present

## 2016-01-01 DIAGNOSIS — I10 Essential (primary) hypertension: Secondary | ICD-10-CM | POA: Diagnosis not present

## 2016-01-01 DIAGNOSIS — G479 Sleep disorder, unspecified: Secondary | ICD-10-CM | POA: Diagnosis not present

## 2016-01-01 DIAGNOSIS — E039 Hypothyroidism, unspecified: Secondary | ICD-10-CM | POA: Diagnosis not present

## 2016-01-10 ENCOUNTER — Other Ambulatory Visit: Payer: Self-pay | Admitting: Neurology

## 2016-01-11 ENCOUNTER — Other Ambulatory Visit: Payer: Self-pay

## 2016-01-11 MED ORDER — GABAPENTIN 400 MG PO CAPS: ORAL_CAPSULE | ORAL | Status: DC

## 2016-04-12 ENCOUNTER — Encounter: Payer: Self-pay | Admitting: Neurology

## 2016-04-12 ENCOUNTER — Ambulatory Visit (INDEPENDENT_AMBULATORY_CARE_PROVIDER_SITE_OTHER): Payer: Medicare Other | Admitting: Neurology

## 2016-04-12 VITALS — BP 137/91 | HR 74 | Ht 67.0 in | Wt 207.0 lb

## 2016-04-12 DIAGNOSIS — G3184 Mild cognitive impairment, so stated: Secondary | ICD-10-CM
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
## 2016-04-12 NOTE — Patient Instructions (Signed)
I had a long discussion the patient with regards to new complaints of mild memory and cognitive difficulties which possibly due to mild cognitive impairment. I encouraged her to increase participation in cognitively challenging activities like solving crossword puzzles, sudoku and playing bridge. I advised her to start taking fish oil daily as well. She may consider possible participation in the [place] early dementia. Continue aspirin 325 mg daily for stroke prevention and strict control of hypertension with blood pressure goal below 130/90 and diabetes with hemoglobin A1c goal below 6.5%. Patient has been more than 4 years since her stroke and do not believe further neurovascular follow-up with me is necessary she may follow-up with her primary care physician. Management of Memory Problems  There are some general things you can do to help manage your memory problems.  Your memory may not in fact recover, but by using techniques and strategies you will be able to manage your memory difficulties better.  1)  Establish a routine.  Try to establish and then stick to a regular routine.  By doing this, you will get used to what to expect and you will reduce the need to rely on your memory.  Also, try to do things at the same time of day, such as taking your medication or checking your calendar first thing in the morning.  Think about think that you can do as a part of a regular routine and make a list.  Then enter them into a daily planner to remind you.  This will help you establish a routine.  2)  Organize your environment.  Organize your environment so that it is uncluttered.  Decrease visual stimulation.  Place everyday items such as keys or cell phone in the same place every day (ie.  Basket next to front door)  Use post it notes with a brief message to yourself (ie. Turn off light, lock the door)  Use labels to indicate where things go (ie. Which cupboards are for food, dishes, etc.)  Keep a  notepad and pen by the telephone to take messages  3)  Memory Aids  A diary or journal/notebook/daily planner  Making a list (shopping list, chore list, to do list that needs to be done)  Using an alarm as a reminder (kitchen timer or cell phone alarm)  Using cell phone to store information (Notes, Calendar, Reminders)  Calendar/White board placed in a prominent position  Post-it notes  In order for memory aids to be useful, you need to have good habits.  It's no good remembering to make a note in your journal if you don't remember to look in it.  Try setting aside a certain time of day to look in journal.  4)  Improving mood and managing fatigue.  There may be other factors that contribute to memory difficulties.  Factors, such as anxiety, depression and tiredness can affect memory.  Regular gentle exercise can help improve your mood and give you more energy.  Simple relaxation techniques may help relieve symptoms of anxiety  Try to get back to completing activities or hobbies you enjoyed doing in the past.  Learn to pace yourself through activities to decrease fatigue.  Find out about some local support groups where you can share experiences with others.  Try and achieve 7-8 hours of sleep at night.

## 2016-04-17 ENCOUNTER — Other Ambulatory Visit: Payer: Self-pay | Admitting: Neurology

## 2016-05-17 ENCOUNTER — Other Ambulatory Visit: Payer: Self-pay | Admitting: Neurology

## 2016-05-21 DIAGNOSIS — I10 Essential (primary) hypertension: Secondary | ICD-10-CM | POA: Diagnosis not present

## 2016-05-21 DIAGNOSIS — E782 Mixed hyperlipidemia: Secondary | ICD-10-CM | POA: Diagnosis not present

## 2016-05-21 DIAGNOSIS — I639 Cerebral infarction, unspecified: Secondary | ICD-10-CM | POA: Diagnosis not present

## 2016-05-21 DIAGNOSIS — E119 Type 2 diabetes mellitus without complications: Secondary | ICD-10-CM | POA: Diagnosis not present

## 2016-05-21 DIAGNOSIS — E039 Hypothyroidism, unspecified: Secondary | ICD-10-CM | POA: Diagnosis not present

## 2016-06-09 ENCOUNTER — Other Ambulatory Visit: Payer: Self-pay | Admitting: Neurology

## 2016-07-08 ENCOUNTER — Other Ambulatory Visit: Payer: Self-pay | Admitting: Neurology

## 2016-08-05 DIAGNOSIS — Z79899 Other long term (current) drug therapy: Secondary | ICD-10-CM | POA: Diagnosis not present

## 2016-08-05 DIAGNOSIS — E119 Type 2 diabetes mellitus without complications: Secondary | ICD-10-CM | POA: Diagnosis not present

## 2016-08-05 DIAGNOSIS — E785 Hyperlipidemia, unspecified: Secondary | ICD-10-CM | POA: Diagnosis not present

## 2016-08-05 DIAGNOSIS — E7800 Pure hypercholesterolemia, unspecified: Secondary | ICD-10-CM | POA: Diagnosis not present

## 2016-08-05 DIAGNOSIS — I10 Essential (primary) hypertension: Secondary | ICD-10-CM | POA: Diagnosis not present

## 2016-09-01 ENCOUNTER — Other Ambulatory Visit: Payer: Self-pay | Admitting: Neurology

## 2016-09-02 ENCOUNTER — Other Ambulatory Visit: Payer: Self-pay

## 2016-09-02 MED ORDER — GABAPENTIN 400 MG PO CAPS: ORAL_CAPSULE | ORAL | 0 refills | Status: DC

## 2016-09-02 MED ORDER — DULOXETINE HCL 60 MG PO CPEP: ORAL_CAPSULE | ORAL | 0 refills | Status: DC

## 2016-10-05 ENCOUNTER — Other Ambulatory Visit: Payer: Self-pay | Admitting: Neurology

## 2016-10-25 DIAGNOSIS — M8589 Other specified disorders of bone density and structure, multiple sites: Secondary | ICD-10-CM | POA: Diagnosis not present

## 2016-10-25 DIAGNOSIS — Z1231 Encounter for screening mammogram for malignant neoplasm of breast: Secondary | ICD-10-CM | POA: Diagnosis not present

## 2016-11-17 DIAGNOSIS — I6930 Unspecified sequelae of cerebral infarction: Secondary | ICD-10-CM | POA: Diagnosis not present

## 2016-11-17 DIAGNOSIS — E0840 Diabetes mellitus due to underlying condition with diabetic neuropathy, unspecified: Secondary | ICD-10-CM | POA: Diagnosis not present

## 2016-11-17 DIAGNOSIS — E039 Hypothyroidism, unspecified: Secondary | ICD-10-CM | POA: Diagnosis not present

## 2016-11-17 DIAGNOSIS — G4733 Obstructive sleep apnea (adult) (pediatric): Secondary | ICD-10-CM | POA: Diagnosis not present

## 2016-11-17 DIAGNOSIS — E785 Hyperlipidemia, unspecified: Secondary | ICD-10-CM | POA: Diagnosis not present

## 2016-12-02 DIAGNOSIS — E785 Hyperlipidemia, unspecified: Secondary | ICD-10-CM | POA: Diagnosis not present

## 2016-12-02 DIAGNOSIS — I10 Essential (primary) hypertension: Secondary | ICD-10-CM | POA: Diagnosis not present

## 2016-12-02 DIAGNOSIS — E119 Type 2 diabetes mellitus without complications: Secondary | ICD-10-CM | POA: Diagnosis not present

## 2016-12-02 DIAGNOSIS — G4733 Obstructive sleep apnea (adult) (pediatric): Secondary | ICD-10-CM | POA: Diagnosis not present

## 2016-12-02 DIAGNOSIS — I6930 Unspecified sequelae of cerebral infarction: Secondary | ICD-10-CM | POA: Diagnosis not present

## 2016-12-23 DIAGNOSIS — H01022 Squamous blepharitis right lower eyelid: Secondary | ICD-10-CM | POA: Diagnosis not present

## 2016-12-23 DIAGNOSIS — E119 Type 2 diabetes mellitus without complications: Secondary | ICD-10-CM | POA: Diagnosis not present

## 2016-12-23 DIAGNOSIS — H04123 Dry eye syndrome of bilateral lacrimal glands: Secondary | ICD-10-CM | POA: Diagnosis not present

## 2016-12-23 DIAGNOSIS — H25813 Combined forms of age-related cataract, bilateral: Secondary | ICD-10-CM | POA: Diagnosis not present

## 2016-12-23 DIAGNOSIS — H01021 Squamous blepharitis right upper eyelid: Secondary | ICD-10-CM | POA: Diagnosis not present

## 2017-01-20 DIAGNOSIS — E039 Hypothyroidism, unspecified: Secondary | ICD-10-CM | POA: Diagnosis not present

## 2017-01-20 DIAGNOSIS — E785 Hyperlipidemia, unspecified: Secondary | ICD-10-CM | POA: Diagnosis not present

## 2017-01-20 DIAGNOSIS — I6330 Cerebral infarction due to thrombosis of unspecified cerebral artery: Secondary | ICD-10-CM | POA: Diagnosis not present

## 2017-01-20 DIAGNOSIS — I10 Essential (primary) hypertension: Secondary | ICD-10-CM | POA: Diagnosis not present

## 2017-01-20 DIAGNOSIS — G4733 Obstructive sleep apnea (adult) (pediatric): Secondary | ICD-10-CM | POA: Diagnosis not present

## 2017-03-03 DIAGNOSIS — E1100 Type 2 diabetes mellitus with hyperosmolarity without nonketotic hyperglycemic-hyperosmolar coma (NKHHC): Secondary | ICD-10-CM | POA: Diagnosis not present

## 2017-03-03 DIAGNOSIS — Z0000 Encounter for general adult medical examination without abnormal findings: Secondary | ICD-10-CM | POA: Diagnosis not present

## 2017-03-03 DIAGNOSIS — I10 Essential (primary) hypertension: Secondary | ICD-10-CM | POA: Diagnosis not present

## 2017-04-12 ENCOUNTER — Other Ambulatory Visit: Payer: Self-pay

## 2017-04-12 NOTE — Patient Outreach (Signed)
[facility]) Care Management  04/12/2017  Shatavia Santor 04-15-1956 [id number]   Medication Adherence call to Mrs. Javaria Knapke the reason for this call is because Mrs. Shafran is showing past due under [facility] Ins.on lisinopril 10 mg spoke to patient she was taken off this medication and is now taking amlodipine 10 mg.  [facility] Management Direct Dial [phone]  Fax [phone] [email or web address]

## 2017-05-02 DIAGNOSIS — E0840 Diabetes mellitus due to underlying condition with diabetic neuropathy, unspecified: Secondary | ICD-10-CM | POA: Diagnosis not present

## 2017-05-02 DIAGNOSIS — E039 Hypothyroidism, unspecified: Secondary | ICD-10-CM | POA: Diagnosis not present

## 2017-05-02 DIAGNOSIS — R413 Other amnesia: Secondary | ICD-10-CM | POA: Diagnosis not present

## 2017-05-02 DIAGNOSIS — I6330 Cerebral infarction due to thrombosis of unspecified cerebral artery: Secondary | ICD-10-CM | POA: Diagnosis not present

## 2017-05-02 DIAGNOSIS — I10 Essential (primary) hypertension: Secondary | ICD-10-CM | POA: Diagnosis not present

## 2017-05-15 ENCOUNTER — Ambulatory Visit (INDEPENDENT_AMBULATORY_CARE_PROVIDER_SITE_OTHER): Payer: Medicare Other

## 2017-05-15 ENCOUNTER — Encounter (HOSPITAL_COMMUNITY): Payer: Self-pay | Admitting: Emergency Medicine

## 2017-05-15 ENCOUNTER — Ambulatory Visit (HOSPITAL_COMMUNITY)
Admission: EM | Admit: 2017-05-15 | Discharge: 2017-05-15 | Disposition: A | Discharge status: Home / Self Care | Payer: Medicare Other | Attending: Emergency Medicine | Admitting: Emergency Medicine

## 2017-05-15 DIAGNOSIS — S52122A Displaced fracture of head of left radius, initial encounter for closed fracture: Secondary | ICD-10-CM

## 2017-05-15 DIAGNOSIS — M25532 Pain in left wrist: Secondary | ICD-10-CM | POA: Diagnosis not present

## 2017-05-15 DIAGNOSIS — W010XXA Fall on same level from slipping, tripping and stumbling without subsequent striking against object, initial encounter: Secondary | ICD-10-CM | POA: Diagnosis not present

## 2017-05-15 DIAGNOSIS — S52572A Other intraarticular fracture of lower end of left radius, initial encounter for closed fracture: Secondary | ICD-10-CM | POA: Diagnosis not present

## 2017-05-15 MED ORDER — HYDROCODONE-ACETAMINOPHEN 5-325 MG PO TABS: 1.0000 | ORAL_TABLET | ORAL | 0 refills | Status: DC | PRN

## 2017-05-15 NOTE — ED Notes (Signed)
Paged ortho tech 

## 2017-05-15 NOTE — ED Notes (Signed)
Anderson Malta, ortho tech notified of order

## 2017-05-15 NOTE — ED Triage Notes (Signed)
Pt states over the weekend she tripped over some toys and landed on her L wrist. L wrist swollen.

## 2017-05-15 NOTE — Discharge Instructions (Signed)
Please call orthopedics today for appointment/evaluation tomorrow. Splint today. Continue ice compress. Take norco as directed, avoid tylenol while on norco. If experiencing worsening symptoms, extreme pain/tightness, numbness/tingling of the fingers, go to the emergency department for further evaluation.
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
## 2017-05-15 NOTE — ED Notes (Signed)
Ortho tech at bedside 

## 2017-05-15 NOTE — Progress Notes (Signed)
Orthopedic Tech Progress Note Patient Details:  Sandy Davis Jul 12, 1955 [id number]  Ortho Devices Type of Ortho Device: Short arm splint, Arm sling Ortho Device/Splint Location: applied short arm sling to pt left arm/wrist.  pt tolerated application very well.  provided arm sling for support.  left wrist/arm Ortho Device/Splint Interventions: Application, Adjustment   Kristopher Oppenheim 05/15/2017, 4:33 PM

## 2017-05-18 DIAGNOSIS — S52572A Other intraarticular fracture of lower end of left radius, initial encounter for closed fracture: Secondary | ICD-10-CM | POA: Diagnosis not present

## 2017-05-18 DIAGNOSIS — M25532 Pain in left wrist: Secondary | ICD-10-CM | POA: Diagnosis not present

## 2017-05-18 DIAGNOSIS — M25632 Stiffness of left wrist, not elsewhere classified: Secondary | ICD-10-CM | POA: Diagnosis not present

## 2017-05-22 ENCOUNTER — Other Ambulatory Visit: Payer: Self-pay

## 2017-05-22 DIAGNOSIS — G8918 Other acute postprocedural pain: Secondary | ICD-10-CM | POA: Diagnosis not present

## 2017-05-22 DIAGNOSIS — D481 Neoplasm of uncertain behavior of connective and other soft tissue: Secondary | ICD-10-CM | POA: Diagnosis not present

## 2017-05-22 DIAGNOSIS — S52572A Other intraarticular fracture of lower end of left radius, initial encounter for closed fracture: Secondary | ICD-10-CM | POA: Diagnosis not present

## 2017-05-22 DIAGNOSIS — D1722 Benign lipomatous neoplasm of skin and subcutaneous tissue of left arm: Secondary | ICD-10-CM | POA: Diagnosis not present

## 2017-05-22 DIAGNOSIS — G5602 Carpal tunnel syndrome, left upper limb: Secondary | ICD-10-CM | POA: Diagnosis not present

## 2017-05-30 DIAGNOSIS — S52572A Other intraarticular fracture of lower end of left radius, initial encounter for closed fracture: Secondary | ICD-10-CM | POA: Diagnosis not present

## 2017-05-30 DIAGNOSIS — S52572D Other intraarticular fracture of lower end of left radius, subsequent encounter for closed fracture with routine healing: Secondary | ICD-10-CM | POA: Diagnosis not present

## 2017-05-30 DIAGNOSIS — M25632 Stiffness of left wrist, not elsewhere classified: Secondary | ICD-10-CM | POA: Diagnosis not present

## 2017-05-30 DIAGNOSIS — M25532 Pain in left wrist: Secondary | ICD-10-CM | POA: Diagnosis not present

## 2017-06-16 ENCOUNTER — Other Ambulatory Visit: Payer: Self-pay | Admitting: Neurology

## 2017-06-20 DIAGNOSIS — S52572D Other intraarticular fracture of lower end of left radius, subsequent encounter for closed fracture with routine healing: Secondary | ICD-10-CM | POA: Diagnosis not present

## 2017-06-21 DIAGNOSIS — E0840 Diabetes mellitus due to underlying condition with diabetic neuropathy, unspecified: Secondary | ICD-10-CM | POA: Diagnosis not present

## 2017-06-21 DIAGNOSIS — I639 Cerebral infarction, unspecified: Secondary | ICD-10-CM | POA: Diagnosis not present

## 2017-06-21 DIAGNOSIS — E039 Hypothyroidism, unspecified: Secondary | ICD-10-CM | POA: Diagnosis not present

## 2017-06-21 DIAGNOSIS — G4733 Obstructive sleep apnea (adult) (pediatric): Secondary | ICD-10-CM | POA: Diagnosis not present

## 2017-06-21 DIAGNOSIS — I10 Essential (primary) hypertension: Secondary | ICD-10-CM | POA: Diagnosis not present

## 2017-06-23 ENCOUNTER — Other Ambulatory Visit: Payer: Self-pay

## 2017-06-23 ENCOUNTER — Encounter (HOSPITAL_COMMUNITY): Payer: Self-pay | Admitting: Emergency Medicine

## 2017-06-23 ENCOUNTER — Ambulatory Visit (HOSPITAL_COMMUNITY)
Admission: EM | Admit: 2017-06-23 | Discharge: 2017-06-23 | Disposition: A | Discharge status: Home / Self Care | Payer: Medicare Other | Attending: Emergency Medicine | Admitting: Emergency Medicine

## 2017-06-23 DIAGNOSIS — B350 Tinea barbae and tinea capitis: Secondary | ICD-10-CM

## 2017-06-23 MED ORDER — GRISEOFULVIN MICROSIZE 500 MG PO TABS: 500.0000 mg | ORAL_TABLET | Freq: Every day | ORAL | 0 refills | Status: DC

## 2017-06-23 MED ORDER — FLUOCINOLONE ACETONIDE SCALP 0.01 % EX OIL: TOPICAL_OIL | CUTANEOUS | 0 refills | Status: DC

## 2017-06-23 NOTE — ED Triage Notes (Signed)
Pt c/o dry itchy scalp x1 week, states "I think I have a fungus".

## 2017-06-23 NOTE — Discharge Instructions (Signed)
Apply the scalp oil as directed. Take the antifungal medicine as directed. Use selenium sulfide also known as Selsun Blue twice a week.
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
## 2017-07-11 ENCOUNTER — Encounter: Payer: Medicare Other | Attending: Psychology | Admitting: Psychology

## 2017-07-11 DIAGNOSIS — F329 Major depressive disorder, single episode, unspecified: Secondary | ICD-10-CM | POA: Diagnosis not present

## 2017-07-11 DIAGNOSIS — Z8673 Personal history of transient ischemic attack (TIA), and cerebral infarction without residual deficits: Secondary | ICD-10-CM | POA: Diagnosis not present

## 2017-07-11 DIAGNOSIS — Z8249 Family history of ischemic heart disease and other diseases of the circulatory system: Secondary | ICD-10-CM | POA: Insufficient documentation

## 2017-07-11 DIAGNOSIS — F419 Anxiety disorder, unspecified: Secondary | ICD-10-CM | POA: Insufficient documentation

## 2017-07-11 DIAGNOSIS — R454 Irritability and anger: Secondary | ICD-10-CM | POA: Insufficient documentation

## 2017-07-11 DIAGNOSIS — E119 Type 2 diabetes mellitus without complications: Secondary | ICD-10-CM | POA: Insufficient documentation

## 2017-07-11 DIAGNOSIS — G4733 Obstructive sleep apnea (adult) (pediatric): Secondary | ICD-10-CM | POA: Insufficient documentation

## 2017-07-11 DIAGNOSIS — G479 Sleep disorder, unspecified: Secondary | ICD-10-CM | POA: Diagnosis not present

## 2017-07-11 DIAGNOSIS — R413 Other amnesia: Secondary | ICD-10-CM

## 2017-07-13 DIAGNOSIS — S52572A Other intraarticular fracture of lower end of left radius, initial encounter for closed fracture: Secondary | ICD-10-CM | POA: Diagnosis not present

## 2017-07-17 DIAGNOSIS — M25632 Stiffness of left wrist, not elsewhere classified: Secondary | ICD-10-CM | POA: Diagnosis not present

## 2017-07-17 DIAGNOSIS — S52572A Other intraarticular fracture of lower end of left radius, initial encounter for closed fracture: Secondary | ICD-10-CM | POA: Diagnosis not present

## 2017-07-17 DIAGNOSIS — R531 Weakness: Secondary | ICD-10-CM | POA: Diagnosis not present

## 2017-07-24 DIAGNOSIS — I639 Cerebral infarction, unspecified: Secondary | ICD-10-CM | POA: Diagnosis not present

## 2017-07-24 DIAGNOSIS — G4733 Obstructive sleep apnea (adult) (pediatric): Secondary | ICD-10-CM | POA: Diagnosis not present

## 2017-08-04 ENCOUNTER — Encounter: Payer: Self-pay | Admitting: Psychology
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
## 2017-08-07 DIAGNOSIS — I6330 Cerebral infarction due to thrombosis of unspecified cerebral artery: Secondary | ICD-10-CM | POA: Diagnosis not present

## 2017-08-07 DIAGNOSIS — E039 Hypothyroidism, unspecified: Secondary | ICD-10-CM | POA: Diagnosis not present

## 2017-08-10 DIAGNOSIS — S52572A Other intraarticular fracture of lower end of left radius, initial encounter for closed fracture: Secondary | ICD-10-CM | POA: Diagnosis not present

## 2017-08-16 DIAGNOSIS — M25632 Stiffness of left wrist, not elsewhere classified: Secondary | ICD-10-CM | POA: Diagnosis not present

## 2017-08-16 DIAGNOSIS — R531 Weakness: Secondary | ICD-10-CM | POA: Diagnosis not present

## 2017-08-16 DIAGNOSIS — S52572A Other intraarticular fracture of lower end of left radius, initial encounter for closed fracture: Secondary | ICD-10-CM | POA: Diagnosis not present

## 2017-08-24 ENCOUNTER — Ambulatory Visit: Payer: Medicare Other | Admitting: Psychology

## 2017-08-24 DIAGNOSIS — R531 Weakness: Secondary | ICD-10-CM | POA: Diagnosis not present

## 2017-08-24 DIAGNOSIS — M25632 Stiffness of left wrist, not elsewhere classified: Secondary | ICD-10-CM | POA: Diagnosis not present

## 2017-08-31 DIAGNOSIS — R531 Weakness: Secondary | ICD-10-CM | POA: Diagnosis not present

## 2017-08-31 DIAGNOSIS — M25632 Stiffness of left wrist, not elsewhere classified: Secondary | ICD-10-CM | POA: Diagnosis not present

## 2017-09-07 DIAGNOSIS — S52572A Other intraarticular fracture of lower end of left radius, initial encounter for closed fracture: Secondary | ICD-10-CM | POA: Diagnosis not present

## 2017-09-08 DIAGNOSIS — I10 Essential (primary) hypertension: Secondary | ICD-10-CM | POA: Diagnosis not present

## 2017-09-19 DIAGNOSIS — L309 Dermatitis, unspecified: Secondary | ICD-10-CM | POA: Diagnosis not present

## 2017-09-19 DIAGNOSIS — M76821 Posterior tibial tendinitis, right leg: Secondary | ICD-10-CM | POA: Diagnosis not present

## 2017-09-19 DIAGNOSIS — L219 Seborrheic dermatitis, unspecified: Secondary | ICD-10-CM | POA: Diagnosis not present

## 2017-09-23 DIAGNOSIS — L308 Other specified dermatitis: Secondary | ICD-10-CM | POA: Diagnosis not present

## 2017-09-25 ENCOUNTER — Other Ambulatory Visit: Payer: Self-pay | Admitting: Family Medicine

## 2017-09-25 DIAGNOSIS — M7121 Synovial cyst of popliteal space [Baker], right knee: Secondary | ICD-10-CM

## 2017-09-27 ENCOUNTER — Ambulatory Visit
Admission: RE | Admit: 2017-09-27 | Discharge: 2017-09-27 | Disposition: A | Discharge status: Home / Self Care | Payer: Medicare Other | Source: Ambulatory Visit | Attending: Family Medicine | Admitting: Family Medicine

## 2017-09-27 DIAGNOSIS — M7121 Synovial cyst of popliteal space [Baker], right knee: Secondary | ICD-10-CM

## 2017-09-27 DIAGNOSIS — S83124A Posterior dislocation of proximal end of tibia, right knee, initial encounter: Secondary | ICD-10-CM | POA: Diagnosis not present

## 2017-10-02 DIAGNOSIS — R2241 Localized swelling, mass and lump, right lower limb: Secondary | ICD-10-CM | POA: Diagnosis not present

## 2017-10-03 ENCOUNTER — Other Ambulatory Visit: Payer: Self-pay | Admitting: Family Medicine

## 2017-10-03 DIAGNOSIS — L280 Lichen simplex chronicus: Secondary | ICD-10-CM | POA: Diagnosis not present

## 2017-10-03 DIAGNOSIS — L308 Other specified dermatitis: Secondary | ICD-10-CM | POA: Diagnosis not present

## 2017-10-03 DIAGNOSIS — L219 Seborrheic dermatitis, unspecified: Secondary | ICD-10-CM | POA: Diagnosis not present

## 2017-10-03 DIAGNOSIS — M25861 Other specified joint disorders, right knee: Secondary | ICD-10-CM

## 2017-10-10 ENCOUNTER — Ambulatory Visit
Admission: RE | Admit: 2017-10-10 | Discharge: 2017-10-10 | Disposition: A | Discharge status: Home / Self Care | Payer: Medicare Other | Source: Ambulatory Visit | Attending: Family Medicine | Admitting: Family Medicine

## 2017-10-10 DIAGNOSIS — M25461 Effusion, right knee: Secondary | ICD-10-CM | POA: Diagnosis not present

## 2017-10-10 DIAGNOSIS — M25861 Other specified joint disorders, right knee: Secondary | ICD-10-CM

## 2017-10-10 MED ORDER — GADOBENATE DIMEGLUMINE 529 MG/ML IV SOLN
20.0000 mL | Freq: Once | INTRAVENOUS | Status: AC | PRN
  Administered 2017-10-10: 20 mL via INTRAVENOUS

## 2017-10-19 DIAGNOSIS — C4921 Malignant neoplasm of connective and soft tissue of right lower limb, including hip: Secondary | ICD-10-CM | POA: Diagnosis not present

## 2017-10-23 ENCOUNTER — Encounter: Payer: Medicare Other | Attending: Psychology | Admitting: Psychology

## 2017-10-23 DIAGNOSIS — R454 Irritability and anger: Secondary | ICD-10-CM | POA: Diagnosis not present

## 2017-10-23 DIAGNOSIS — Z8249 Family history of ischemic heart disease and other diseases of the circulatory system: Secondary | ICD-10-CM | POA: Insufficient documentation

## 2017-10-23 DIAGNOSIS — E119 Type 2 diabetes mellitus without complications: Secondary | ICD-10-CM | POA: Diagnosis not present

## 2017-10-23 DIAGNOSIS — G479 Sleep disorder, unspecified: Secondary | ICD-10-CM | POA: Insufficient documentation

## 2017-10-23 DIAGNOSIS — Z8673 Personal history of transient ischemic attack (TIA), and cerebral infarction without residual deficits: Secondary | ICD-10-CM | POA: Diagnosis not present

## 2017-10-23 DIAGNOSIS — G4733 Obstructive sleep apnea (adult) (pediatric): Secondary | ICD-10-CM | POA: Insufficient documentation

## 2017-10-23 DIAGNOSIS — F419 Anxiety disorder, unspecified: Secondary | ICD-10-CM | POA: Insufficient documentation

## 2017-10-23 DIAGNOSIS — F329 Major depressive disorder, single episode, unspecified: Secondary | ICD-10-CM | POA: Diagnosis not present

## 2017-10-23 DIAGNOSIS — R413 Other amnesia: Secondary | ICD-10-CM | POA: Insufficient documentation

## 2017-10-27 DIAGNOSIS — Z1231 Encounter for screening mammogram for malignant neoplasm of breast: Secondary | ICD-10-CM | POA: Diagnosis not present

## 2017-10-29 ENCOUNTER — Other Ambulatory Visit: Payer: Self-pay | Admitting: Neurology

## 2017-10-30 ENCOUNTER — Other Ambulatory Visit: Payer: Self-pay | Admitting: General Surgery

## 2017-10-30 ENCOUNTER — Other Ambulatory Visit: Payer: Self-pay

## 2017-10-30 DIAGNOSIS — R2241 Localized swelling, mass and lump, right lower limb: Secondary | ICD-10-CM | POA: Diagnosis not present

## 2017-10-30 MED ORDER — GABAPENTIN 400 MG PO CAPS: ORAL_CAPSULE | ORAL | 1 refills | Status: DC

## 2017-10-30 NOTE — H&P (Signed)
[name] Documented: 10/30/2017 2:08 PM Location: [facility] Patient #: [id number] DOB: 07/08/1955 Undefined / Language: Cleophus Molt / Race: Black or African American Female   History of Present Illness Stark Klein MD; 10/30/2017 2:31 PM) The patient is a 62 year old female who presents with a soft tissue mass. Pt is a 62 yo F who is referred by Dr. Criss Rosales for a possible liposarcoma. She started having lateral right calf pain several months ago. Over the next few weeks she noted a mass growing on the upper inner right calf. She was quite concerned and had went to her PCP. An u/s was performed, followed by an MRI. This is concerning for a liposarcoma. She has noted growth in the last 4 weeks since she started having imaging studies. She is worried. She has had a prior microscopic polyangiitis in 2004 that she says was called a type of cancer and she took chemotherapy for. Her daughter has had a GIST of the stomach. She reports being up to date wtih colonoscopy and mammogram. She denies weakness or numbness of her right calf.   MRI right knee 10/10/2017 IMPRESSION: 1. 3.5 x 3.2 x 4.1 cm soft tissue mass along the posteromedial aspect of the knee within the subcutaneous fat. Overall appearance is most concerning for a liposarcoma. 2. Tricompartmental cartilage abnormalities as described above. 3. Radial tear of the body of the lateral meniscus.   Past Surgical History Levonne Spiller, [place]; 10/30/2017 2:08 PM) Hysterectomy (due to cancer) - Complete  Oral Surgery   Diagnostic Studies History Levonne Spiller, CMA; 10/30/2017 2:08 PM) Colonoscopy  5-10 years ago Mammogram  within last year Pap Smear  >5 years ago  Allergies Levonne Spiller, [place]; 10/30/2017 2:08 PM) No Known Allergies [10/30/2017]: Allergies Reconciled   Medication History Levonne Spiller, CMA; 10/30/2017 2:09 PM) Doxazosin Mesylate (2MG  Tablet, Oral) Active. Jardiance (25MG   Tablet, Oral) Active. AmLODIPine Besylate (10MG  Tablet, Oral) Active. Aspirin (81MG  Tablet DR, Oral) Active. Medications Reconciled  Social History Andee Poles Education officer, museum, CMA; 10/30/2017 2:08 PM) Alcohol use  Moderate alcohol use. Caffeine use  Coffee. No drug use  Tobacco use  Former smoker.  Family History Levonne Spiller, [place]; 10/30/2017 2:08 PM) Cervical Cancer  Family Members In General. Depression  Daughter. Diabetes Mellitus  Family Members In General. Heart Disease  Family Members In General. Hypertension  Family Members In General. Kidney Disease  Family Members In General. Seizure disorder  Family Members In General.  Pregnancy / Birth History Levonne Spiller, [place]; 10/30/2017 2:08 PM) Age at menarche  89 years. Age of menopause  <44 Gravida  3 Irregular periods  Maternal age  42-20 Para  3  Other Problems Levonne Spiller, [place]; 10/30/2017 2:08 PM) Cerebrovascular Accident  Cholelithiasis  Diabetes Mellitus  High blood pressure  Myocardial infarction     Review of Systems Andee Poles Gerrigner CMA; 10/30/2017 2:08 PM) Skin Present- Dryness and Rash. Not Present- Change in Wart/Mole, Hives, Jaundice, New Lesions, Non-Healing Wounds and Ulcer. HEENT Present- Wears glasses/contact lenses. Not Present- Earache, Hearing Loss, Hoarseness, Nose Bleed, Oral Ulcers, Ringing in the Ears, Seasonal Allergies, Sinus Pain, Sore Throat, Visual Disturbances and Yellow Eyes. Respiratory Present- Snoring. Not Present- Bloody sputum, Chronic Cough, Difficulty Breathing and Wheezing. Breast Not Present- Breast Mass, Breast Pain, Nipple Discharge and Skin Changes. Cardiovascular Not Present- Chest Pain, Difficulty Breathing Lying Down, Leg Cramps, Palpitations, Rapid Heart Rate, Shortness of Breath and Swelling of Extremities. Gastrointestinal Not Present- Abdominal Pain, Bloating, Bloody Stool, Change in Bowel Habits, Chronic diarrhea, Constipation,  Difficulty Swallowing, Excessive gas, Gets full quickly at meals, Hemorrhoids, Indigestion, Nausea, Rectal Pain and Vomiting. Female Genitourinary Present- Urgency. Not Present- Frequency, Nocturia, Painful Urination and Pelvic Pain. Musculoskeletal Present- Joint Pain, Joint Stiffness and Swelling of Extremities. Not Present- Back Pain, Muscle Pain and Muscle Weakness. Neurological Present- Numbness and Tingling. Not Present- Decreased Memory, Fainting, Headaches, Seizures, Tremor, Trouble walking and Weakness. Psychiatric Present- Change in Sleep Pattern. Not Present- Anxiety, Bipolar, Depression, Fearful and Frequent crying. Endocrine Present- Hair Changes. Not Present- Cold Intolerance, Excessive Hunger, Heat Intolerance, Hot flashes and New Diabetes. Hematology Not Present- Blood Thinners, Easy Bruising, Excessive bleeding, Gland problems, HIV and Persistent Infections.  Vitals (Danielle Gerrigner CMA; 10/30/2017 2:10 PM) 10/30/2017 2:09 PM Weight: 205 lb Height: 67in Body Surface Area: 2.04 m Body Mass Index: 32.11 kg/m  Temp.: 98.32F(Oral)  Pulse: 125 (Regular)  BP: 150/96 (Sitting, Left Arm, Standard)       Physical Exam Stark Klein MD; 10/30/2017 2:33 PM) General Mental Status-Alert. General Appearance-Consistent with stated age. Hydration-Well hydrated. Voice-Normal.  Head and Neck Head-normocephalic, atraumatic with no lesions or palpable masses. Trachea-midline. Thyroid Gland Characteristics - normal size and consistency.  Eye Eyeball - Bilateral-Extraocular movements intact. Sclera/Conjunctiva - Bilateral-No scleral icterus.  Chest and Lung Exam Chest and lung exam reveals -quiet, even and easy respiratory effort with no use of accessory muscles and on auscultation, normal breath sounds, no adventitious sounds and normal vocal resonance. Inspection Chest Wall - Normal. Back - normal.  Cardiovascular Cardiovascular examination  reveals -normal heart sounds, regular rate and rhythm with no murmurs and normal pedal pulses bilaterally.  Abdomen Inspection Inspection of the abdomen reveals - No Hernias. Palpation/Percussion Palpation and Percussion of the abdomen reveal - Soft, Non Tender, No Rebound tenderness, No Rigidity (guarding) and No hepatosplenomegaly. Auscultation Auscultation of the abdomen reveals - Bowel sounds normal.  Neurologic Neurologic evaluation reveals -alert and oriented x 3 with no impairment of recent or remote memory. Mental Status-Normal.  Musculoskeletal Global Assessment -Note: no gross deformities.  Normal Exam - Left-Upper Extremity Strength Normal and Lower Extremity Strength Normal. Normal Exam - Right-Upper Extremity Strength Normal and Lower Extremity Strength Normal. Note: right upper inner calf with a 4x3x3 cm mass that is mobile. The skin is thinned out over the top. It is centered around 2 cm below the crease of the knee. It is non tender. No erythema. No appreciable right leg swelling is seen.   Lymphatic Head & Neck  General Head & Neck Lymphatics: Bilateral - Description - Normal. Axillary  General Axillary Region: Bilateral - Description - Normal. Tenderness - Non Tender. Femoral & Inguinal  Generalized Femoral & Inguinal Lymphatics: Bilateral - Description - No Generalized lymphadenopathy.    Assessment & Plan Stark Klein MD; 10/30/2017 2:36 PM) MASS OF SOFT TISSUE OF RIGHT LOWER EXTREMITY (R22.41) Impression: I am quite concerned that this may be a sarcoma given the rapid growth, imaging characteristics, and symptoms of pain. it is more firm than a lipoma would be. i am not going to plan a percutaneous biopsy as I think this would slow down treatment. I would plan a wider excision than normal. I discussed that deep to the mass are the nerves and vessels to the foot. These are not touching based on the scan, but that was 3 weeks ago. I would not  sacrifice these.  I discussed that there is risk of positive margin and risk of additional surgeries, additional treatments required, and possible recurrence. I discussed risk of numbness. I  also discussed risk of wound complications. I will make a longitudinal incision.  She understands and wishes to proceed.    Signed by Stark Klein, MD (10/30/2017 2:39 PM)

## 2017-10-31 ENCOUNTER — Encounter: Payer: Self-pay | Admitting: Psychology

## 2017-10-31 NOTE — Pre-Procedure Instructions (Addendum)
Sandy Davis  10/31/2017    Your procedure is scheduled on Thursday, Nov 02, 2017 at 7:30 AM.   Report to [facility] Entrance "A" Admitting Office at 5:30 AM.   Call this number if you have problems the morning of surgery: [phone]   Remember:  Do not eat food or drink liquids after midnight tonight.   Take these medicines the morning of surgery with A SIP OF WATER: Amlodipine (Norvasc), Doxazosin (Cardura) Do not take Empagliflozin (Jardiance) in the AM.  Take 1/2 of your Levemir Insulin (take 20 units) in the morning.    Drink the Ensure drink as you're leaving for the hospital in the morning.   How to Manage Your Diabetes Before Surgery   Why is it important to control my blood sugar before and after surgery?   Improving blood sugar levels before and after surgery helps healing and can limit problems.  A way of improving blood sugar control is eating a healthy diet by:  - Eating less sugar and carbohydrates  - Increasing activity/exercise  - Talk with your doctor about reaching your blood sugar goals  High blood sugars (greater than 180 mg/dL) can raise your risk of infections and slow down your recovery so you will need to focus on controlling your diabetes during the weeks before surgery.  Make sure that the doctor who takes care of your diabetes knows about your planned surgery including the date and location.  How do I manage my blood sugars before surgery?   Check your blood sugar at least 4 times a day, 2 days before surgery to make sure that they are not too high or low.  Check your blood sugar the morning of your surgery when you wake up and every 2 hours until you get to the Short-Stay unit.  Treat a low blood sugar (less than 70 mg/dL) with 1/2 cup of clear juice (cranberry or apple), 4 glucose tablets, OR glucose gel.  Recheck blood sugar in 15 minutes after treatment (to make sure it is greater than 70 mg/dL).  If blood sugar is not greater  than 70 mg/dL on re-check, call [phone] for further instructions.   Report your blood sugar to the Short-Stay nurse when you get to Short-Stay.  References:  [employer], 2007 "How to Manage your Diabetes Before and After Surgery".   Do not wear jewelry, make-up or nail polish.  Do not wear lotions, powders, perfumes or deodorant.  Do not shave 48 hours prior to surgery.    Do not bring valuables to the hospital.  [facility] is not responsible for any belongings or valuables.  Contacts, dentures or bridgework may not be worn into surgery.  Leave your suitcase in the car.  After surgery it may be brought to your room.  For patients admitted to the hospital, discharge time will be determined by your treatment team.  Patients discharged the day of surgery will not be allowed to drive home.   [place] - Preparing for Surgery  Before surgery, you can play an important role.  Because skin is not sterile, your skin needs to be as free of germs as possible.  You can reduce the number of germs on you skin by washing with CHG (chlorahexidine gluconate) soap before surgery.  CHG is an antiseptic cleaner which kills germs and bonds with the skin to continue killing germs even after washing.  Please DO NOT use if you have an allergy to CHG  or antibacterial soaps.  If your skin becomes reddened/irritated stop using the CHG and inform your nurse when you arrive at Short Stay.  Do not shave (including legs and underarms) for at least 48 hours prior to the first CHG shower.  You may shave your face.  Please follow these instructions carefully:   1.  Shower with CHG Soap the night before surgery and the                    morning of Surgery.  2.  If you choose to wash your hair, wash your hair first as usual with your       normal shampoo.  3.  After you shampoo, rinse your hair and body thoroughly to remove the shampoo.  4.  Use CHG as you would any other liquid  soap.  You can apply chg directly       to the skin and wash gently with scrungie or a clean washcloth.  5.  Apply the CHG Soap to your body ONLY FROM THE NECK DOWN.        Do not use on open wounds or open sores.  Avoid contact with your eyes, ears, mouth and genitals (private parts).  Wash genitals (private parts) with your normal soap.  6.  Wash thoroughly, paying special attention to the area where your surgery        will be performed.  7.  Thoroughly rinse your body with warm water from the neck down.  8.  DO NOT shower/wash with your normal soap after using and rinsing off       the CHG Soap.  9.  Pat yourself dry with a clean towel.            10.  Wear clean pajamas.            11.  Place clean sheets on your bed the night of your first shower and do not        sleep with pets.  Day of Surgery  Shower as above. Do not apply any lotions/deodorants the morning of surgery.  Please wear clean clothes to the hospital.   Please read over the fact sheets that you were given.

## 2017-10-31 NOTE — Progress Notes (Signed)
Reviewed current symptoms.  Patient reports significant improvement once the Neurontin was dq.  The patient reports good mood and improved memory functioning.

## 2017-11-01 ENCOUNTER — Encounter (HOSPITAL_COMMUNITY)
Admission: RE | Admit: 2017-11-01 | Discharge: 2017-11-01 | Disposition: A | Discharge status: Home / Self Care | Payer: Medicare Other | Source: Ambulatory Visit | Attending: General Surgery | Admitting: General Surgery

## 2017-11-01 ENCOUNTER — Other Ambulatory Visit (HOSPITAL_COMMUNITY): Payer: Self-pay | Admitting: *Deleted

## 2017-11-01 ENCOUNTER — Encounter (HOSPITAL_COMMUNITY): Payer: Self-pay

## 2017-11-01 ENCOUNTER — Other Ambulatory Visit: Payer: Self-pay

## 2017-11-01 DIAGNOSIS — R2241 Localized swelling, mass and lump, right lower limb: Secondary | ICD-10-CM | POA: Diagnosis present

## 2017-11-01 DIAGNOSIS — Z859 Personal history of malignant neoplasm, unspecified: Secondary | ICD-10-CM | POA: Diagnosis not present

## 2017-11-01 DIAGNOSIS — G4730 Sleep apnea, unspecified: Secondary | ICD-10-CM | POA: Diagnosis not present

## 2017-11-01 DIAGNOSIS — F329 Major depressive disorder, single episode, unspecified: Secondary | ICD-10-CM | POA: Diagnosis not present

## 2017-11-01 DIAGNOSIS — Z7984 Long term (current) use of oral hypoglycemic drugs: Secondary | ICD-10-CM | POA: Diagnosis not present

## 2017-11-01 DIAGNOSIS — Z87891 Personal history of nicotine dependence: Secondary | ICD-10-CM | POA: Diagnosis not present

## 2017-11-01 DIAGNOSIS — Z01812 Encounter for preprocedural laboratory examination: Secondary | ICD-10-CM | POA: Diagnosis not present

## 2017-11-01 DIAGNOSIS — I10 Essential (primary) hypertension: Secondary | ICD-10-CM | POA: Diagnosis not present

## 2017-11-01 DIAGNOSIS — E119 Type 2 diabetes mellitus without complications: Secondary | ICD-10-CM | POA: Diagnosis not present

## 2017-11-01 DIAGNOSIS — M7989 Other specified soft tissue disorders: Secondary | ICD-10-CM | POA: Diagnosis not present

## 2017-11-01 HISTORY — DX: Chronic kidney disease, unspecified: N18.9

## 2017-11-01 HISTORY — DX: Anemia, unspecified: D64.9

## 2017-11-01 HISTORY — DX: Polyneuropathy, unspecified: G62.9

## 2017-11-01 HISTORY — DX: Malignant (primary) neoplasm, unspecified: C80.1

## 2017-11-01 HISTORY — DX: Other amnesia: R41.3

## 2017-11-01 LAB — COMPREHENSIVE METABOLIC PANEL
ALBUMIN: 3.6 g/dL (ref 3.5–5.0)
ALT: 12 U/L — AB (ref 14–54)
AST: 13 U/L — AB (ref 15–41)
Alkaline Phosphatase: 95 U/L (ref 38–126)
Anion gap: 10 (ref 5–15)
BUN: 13 mg/dL (ref 6–20)
CHLORIDE: 107 mmol/L (ref 101–111)
CO2: 25 mmol/L (ref 22–32)
CREATININE: 0.66 mg/dL (ref 0.44–1.00)
Calcium: 10.2 mg/dL (ref 8.9–10.3)
GFR calc Af Amer: >60 mL/min (ref >60)
GFR calc non Af Amer: >60 mL/min (ref >60)
GLUCOSE: 88 mg/dL (ref 65–99)
POTASSIUM: 3.8 mmol/L (ref 3.5–5.1)
SODIUM: 142 mmol/L (ref 135–145)
Total Bilirubin: 0.7 mg/dL (ref 0.3–1.2)
Total Protein: 7.2 g/dL (ref 6.5–8.1)

## 2017-11-01 LAB — CBC WITH DIFFERENTIAL/PLATELET
BASOS PCT: 0 %
Basophils Absolute: 0 10*3/uL (ref 0.0–0.1)
EOS PCT: 4 %
Eosinophils Absolute: 0.3 10*3/uL (ref 0.0–0.7)
HCT: 38.9 % (ref 36.0–46.0)
Hemoglobin: 12.4 g/dL (ref 12.0–15.0)
Lymphocytes Relative: 31 %
Lymphs Abs: 2.5 10*3/uL (ref 0.7–4.0)
MCH: 29.7 pg (ref 26.0–34.0)
MCHC: 31.9 g/dL (ref 30.0–36.0)
MCV: 93.1 fL (ref 78.0–100.0)
MONO ABS: 0.4 10*3/uL (ref 0.1–1.0)
Monocytes Relative: 5 %
Neutro Abs: 4.8 10*3/uL (ref 1.7–7.7)
Neutrophils Relative %: 60 %
PLATELETS: 253 10*3/uL (ref 150–400)
RBC: 4.18 MIL/uL (ref 3.87–5.11)
RDW: 12.4 % (ref 11.5–15.5)
WBC: 7.9 10*3/uL (ref 4.0–10.5)

## 2017-11-01 LAB — GLUCOSE, CAPILLARY: GLUCOSE-CAPILLARY: 77 mg/dL (ref 65–99)

## 2017-11-01 LAB — HEMOGLOBIN A1C
Hgb A1c MFr Bld: 6.6 % — ABNORMAL HIGH (ref 4.8–5.6)
Mean Plasma Glucose: 142.72 mg/dL
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
## 2017-11-01 NOTE — Progress Notes (Signed)
Pt states she has a Thoracic Aortic Aneurysm that was found in 2008 when she was in [place]. She states she was first told she had had a heart attack and they wanted to do a heart cath, but she didn't want them to do one. She states that then they found the aneurysm and they didn't do a cath. I have requested records from [facility] in [place]. Pt denies any recent chest pain or sob. Pt had a CT chest and aorta done in 2014. The report read: IMPRESSION:  The thoracic aorta is of normal caliber and demonstrates no evidence of aneurysmal disease.  No acute aortic pathology is identified.  Incidental note made of either hypoplasia or chronic occlusion of the proximal left vertebral artery.  Pt states she has had a stroke and still has right side weakness and numbness. Dr. Leonie Man is her neurologist.  Pt's PCP is Dr. Criss Rosales who takes care of her Diabetes. Pt is a type 2 diabetic.States she's had an A1C done by Dr. Criss Rosales in the last 2 months. Have requested that result and an EKG from Dr. Fransico Setters office.

## 2017-11-02 ENCOUNTER — Ambulatory Visit (HOSPITAL_COMMUNITY): Payer: Medicare Other | Admitting: Anesthesiology

## 2017-11-02 ENCOUNTER — Ambulatory Visit (HOSPITAL_COMMUNITY)
Admission: RE | Admit: 2017-11-02 | Discharge: 2017-11-02 | Disposition: A | Discharge status: Home / Self Care | Payer: Medicare Other | Source: Ambulatory Visit | Attending: General Surgery | Admitting: General Surgery

## 2017-11-02 ENCOUNTER — Encounter (HOSPITAL_COMMUNITY): Payer: Self-pay

## 2017-11-02 ENCOUNTER — Encounter (HOSPITAL_COMMUNITY)
Admission: RE | Disposition: A | Discharge status: Home / Self Care | Payer: Self-pay | Source: Ambulatory Visit | Attending: General Surgery

## 2017-11-02 ENCOUNTER — Ambulatory Visit (HOSPITAL_COMMUNITY): Payer: Medicare Other | Admitting: Vascular Surgery

## 2017-11-02 DIAGNOSIS — F329 Major depressive disorder, single episode, unspecified: Secondary | ICD-10-CM | POA: Insufficient documentation

## 2017-11-02 DIAGNOSIS — Z859 Personal history of malignant neoplasm, unspecified: Secondary | ICD-10-CM | POA: Insufficient documentation

## 2017-11-02 DIAGNOSIS — I10 Essential (primary) hypertension: Secondary | ICD-10-CM | POA: Diagnosis not present

## 2017-11-02 DIAGNOSIS — I639 Cerebral infarction, unspecified: Secondary | ICD-10-CM | POA: Diagnosis not present

## 2017-11-02 DIAGNOSIS — Z87891 Personal history of nicotine dependence: Secondary | ICD-10-CM | POA: Insufficient documentation

## 2017-11-02 DIAGNOSIS — Z01812 Encounter for preprocedural laboratory examination: Secondary | ICD-10-CM | POA: Diagnosis not present

## 2017-11-02 DIAGNOSIS — Z7984 Long term (current) use of oral hypoglycemic drugs: Secondary | ICD-10-CM | POA: Insufficient documentation

## 2017-11-02 DIAGNOSIS — E119 Type 2 diabetes mellitus without complications: Secondary | ICD-10-CM | POA: Insufficient documentation

## 2017-11-02 DIAGNOSIS — M7989 Other specified soft tissue disorders: Secondary | ICD-10-CM | POA: Diagnosis not present

## 2017-11-02 DIAGNOSIS — G4730 Sleep apnea, unspecified: Secondary | ICD-10-CM | POA: Insufficient documentation

## 2017-11-02 DIAGNOSIS — L089 Local infection of the skin and subcutaneous tissue, unspecified: Secondary | ICD-10-CM | POA: Diagnosis not present

## 2017-11-02 DIAGNOSIS — L988 Other specified disorders of the skin and subcutaneous tissue: Secondary | ICD-10-CM | POA: Diagnosis not present

## 2017-11-02 DIAGNOSIS — R2241 Localized swelling, mass and lump, right lower limb: Secondary | ICD-10-CM | POA: Diagnosis not present

## 2017-11-02 HISTORY — DX: Localized swelling, mass and lump, right lower limb: R22.41

## 2017-11-02 HISTORY — PX: MASS EXCISION: SHX2000

## 2017-11-02 LAB — GLUCOSE, CAPILLARY
GLUCOSE-CAPILLARY: 355 mg/dL — AB (ref 65–99)
GLUCOSE-CAPILLARY: 98 mg/dL (ref 65–99)
Glucose-Capillary: 236 mg/dL — ABNORMAL HIGH (ref 65–99)

## 2017-11-02 SURGERY — EXCISION MASS
Anesthesia: General | Site: Leg Lower | Laterality: Right

## 2017-11-02 MED ORDER — ONDANSETRON HCL 4 MG/2ML IJ SOLN
INTRAMUSCULAR | Status: AC
  Filled 2017-11-02: qty 2

## 2017-11-02 MED ORDER — SODIUM CHLORIDE 0.9 % IV SOLN
INTRAVENOUS | Status: DC | PRN
  Administered 2017-11-02: 08:00:00 via INTRAVENOUS

## 2017-11-02 MED ORDER — ACETAMINOPHEN 500 MG PO TABS
1000.0000 mg | ORAL_TABLET | ORAL | Status: AC
  Administered 2017-11-02: 1000 mg via ORAL
  Filled 2017-11-02: qty 2

## 2017-11-02 MED ORDER — MIDAZOLAM HCL 5 MG/5ML IJ SOLN
INTRAMUSCULAR | Status: DC | PRN
  Administered 2017-11-02: 2 mg via INTRAVENOUS

## 2017-11-02 MED ORDER — PROMETHAZINE HCL 25 MG/ML IJ SOLN: 6.2500 mg | INTRAMUSCULAR | Status: DC | PRN

## 2017-11-02 MED ORDER — GABAPENTIN 300 MG PO CAPS
300.0000 mg | ORAL_CAPSULE | ORAL | Status: AC
  Administered 2017-11-02: 300 mg via ORAL
  Filled 2017-11-02: qty 1

## 2017-11-02 MED ORDER — INSULIN ASPART 100 UNIT/ML ~~LOC~~ SOLN
10.0000 [IU] | Freq: Once | SUBCUTANEOUS | Status: AC
  Administered 2017-11-02: 10 [IU] via SUBCUTANEOUS

## 2017-11-02 MED ORDER — LIDOCAINE HCL 1 % IJ SOLN
INTRAMUSCULAR | Status: DC | PRN
  Administered 2017-11-02: 20 mL

## 2017-11-02 MED ORDER — FENTANYL CITRATE (PF) 250 MCG/5ML IJ SOLN
INTRAMUSCULAR | Status: AC
Start: 2017-11-02 — End: ?
  Filled 2017-11-02: qty 5

## 2017-11-02 MED ORDER — LIDOCAINE HCL (PF) 1 % IJ SOLN
INTRAMUSCULAR | Status: AC
  Filled 2017-11-02: qty 30

## 2017-11-02 MED ORDER — OXYCODONE HCL 5 MG PO TABS: 5.0000 mg | ORAL_TABLET | Freq: Once | ORAL | Status: DC | PRN

## 2017-11-02 MED ORDER — MIDAZOLAM HCL 2 MG/2ML IJ SOLN
INTRAMUSCULAR | Status: AC
  Filled 2017-11-02: qty 2

## 2017-11-02 MED ORDER — SODIUM CHLORIDE 0.9 % IV SOLN
100.0000 [IU] | INTRAVENOUS | Status: AC
  Administered 2017-11-02: 1.8 [IU]/h via INTRAVENOUS
  Filled 2017-11-02: qty 1

## 2017-11-02 MED ORDER — 0.9 % SODIUM CHLORIDE (POUR BTL) OPTIME
TOPICAL | Status: DC | PRN
  Administered 2017-11-02: 1000 mL

## 2017-11-02 MED ORDER — BUPIVACAINE-EPINEPHRINE (PF) 0.25% -1:200000 IJ SOLN
INTRAMUSCULAR | Status: AC
  Filled 2017-11-02: qty 30

## 2017-11-02 MED ORDER — OXYCODONE HCL 5 MG PO TABS: 5.0000 mg | ORAL_TABLET | Freq: Four times a day (QID) | ORAL | 0 refills | Status: DC | PRN

## 2017-11-02 MED ORDER — HYDROMORPHONE HCL 2 MG/ML IJ SOLN: 0.3000 mg | INTRAMUSCULAR | Status: DC | PRN

## 2017-11-02 MED ORDER — INSULIN ASPART 100 UNIT/ML ~~LOC~~ SOLN
SUBCUTANEOUS | Status: AC
  Administered 2017-11-02: 10 [IU] via SUBCUTANEOUS
  Filled 2017-11-02: qty 1

## 2017-11-02 MED ORDER — MEPERIDINE HCL 50 MG/ML IJ SOLN: 6.2500 mg | INTRAMUSCULAR | Status: DC | PRN

## 2017-11-02 MED ORDER — LIDOCAINE 2% (20 MG/ML) 5 ML SYRINGE
INTRAMUSCULAR | Status: DC | PRN
  Administered 2017-11-02: 60 mg via INTRAVENOUS

## 2017-11-02 MED ORDER — CHLORHEXIDINE GLUCONATE CLOTH 2 % EX PADS: 6.0000 | MEDICATED_PAD | Freq: Once | CUTANEOUS | Status: DC

## 2017-11-02 MED ORDER — LACTATED RINGERS IV SOLN
INTRAVENOUS | Status: DC | PRN
  Administered 2017-11-02: 07:00:00 via INTRAVENOUS

## 2017-11-02 MED ORDER — FENTANYL CITRATE (PF) 100 MCG/2ML IJ SOLN
INTRAMUSCULAR | Status: DC | PRN
  Administered 2017-11-02 (×2): 50 ug via INTRAVENOUS

## 2017-11-02 MED ORDER — PROPOFOL 10 MG/ML IV BOLUS
INTRAVENOUS | Status: DC | PRN
  Administered 2017-11-02: 150 mg via INTRAVENOUS

## 2017-11-02 MED ORDER — OXYCODONE HCL 5 MG/5ML PO SOLN: 5.0000 mg | Freq: Once | ORAL | Status: DC | PRN

## 2017-11-02 MED ORDER — CEFAZOLIN SODIUM-DEXTROSE 2-4 GM/100ML-% IV SOLN
2.0000 g | INTRAVENOUS | Status: AC
  Administered 2017-11-02: 2 g via INTRAVENOUS
  Filled 2017-11-02: qty 100

## 2017-11-02 MED ORDER — ONDANSETRON HCL 4 MG/2ML IJ SOLN
INTRAMUSCULAR | Status: DC | PRN
  Administered 2017-11-02: 4 mg via INTRAVENOUS

## 2017-11-02 MED ORDER — LIDOCAINE 2% (20 MG/ML) 5 ML SYRINGE
INTRAMUSCULAR | Status: AC
  Filled 2017-11-02: qty 5

## 2017-11-02 SURGICAL SUPPLY — 48 items
BENZOIN TINCTURE PRP APPL 2/3 (GAUZE/BANDAGES/DRESSINGS) ×3 IMPLANT
BNDG COHESIVE 4X5 TAN STRL (GAUZE/BANDAGES/DRESSINGS) ×3 IMPLANT
BNDG COHESIVE 6X5 TAN STRL LF (GAUZE/BANDAGES/DRESSINGS) ×3 IMPLANT
BNDG GAUZE STRTCH 6 (GAUZE/BANDAGES/DRESSINGS) ×3 IMPLANT
CANISTER SUCT 3000ML PPV (MISCELLANEOUS) ×3 IMPLANT
CHLORAPREP W/TINT 26ML (MISCELLANEOUS) ×3 IMPLANT
CLOSURE STERI-STRIP 1/2X4 (GAUZE/BANDAGES/DRESSINGS) ×1
CLOSURE WOUND 1/2 X4 (GAUZE/BANDAGES/DRESSINGS)
CLSR STERI-STRIP ANTIMIC 1/2X4 (GAUZE/BANDAGES/DRESSINGS) ×2 IMPLANT
COVER SURGICAL LIGHT HANDLE (MISCELLANEOUS) ×3 IMPLANT
DERMABOND ADVANCED (GAUZE/BANDAGES/DRESSINGS) ×2
DERMABOND ADVANCED .7 DNX12 (GAUZE/BANDAGES/DRESSINGS) ×1 IMPLANT
DRAPE LAPAROSCOPIC ABDOMINAL (DRAPES) ×3 IMPLANT
DRAPE UTILITY XL STRL (DRAPES) ×6 IMPLANT
DRSG TEGADERM 4X4.75 (GAUZE/BANDAGES/DRESSINGS) ×3 IMPLANT
ELECT CAUTERY BLADE 6.4 (BLADE) ×3 IMPLANT
ELECT REM PT RETURN 9FT ADLT (ELECTROSURGICAL) ×3
ELECTRODE REM PT RTRN 9FT ADLT (ELECTROSURGICAL) ×1 IMPLANT
GAUZE SPONGE 4X4 12PLY STRL (GAUZE/BANDAGES/DRESSINGS) IMPLANT
GAUZE SPONGE 4X4 16PLY XRAY LF (GAUZE/BANDAGES/DRESSINGS) ×3 IMPLANT
GLOVE BIO SURGEON STRL SZ 6 (GLOVE) ×3 IMPLANT
GLOVE INDICATOR 6.5 STRL GRN (GLOVE) ×3 IMPLANT
GLOVE SURG SS PI 7.0 STRL IVOR (GLOVE) ×6 IMPLANT
GOWN STRL REUS W/ TWL LRG LVL3 (GOWN DISPOSABLE) ×1 IMPLANT
GOWN STRL REUS W/TWL 2XL LVL3 (GOWN DISPOSABLE) ×3 IMPLANT
GOWN STRL REUS W/TWL LRG LVL3 (GOWN DISPOSABLE) ×2
KIT BASIN OR (CUSTOM PROCEDURE TRAY) ×3 IMPLANT
KIT MARKER MARGIN INK (KITS) ×3 IMPLANT
KIT TURNOVER KIT B (KITS) ×3 IMPLANT
NEEDLE HYPO 25GX1X1/2 BEV (NEEDLE) ×3 IMPLANT
NS IRRIG 1000ML POUR BTL (IV SOLUTION) ×3 IMPLANT
PACK SURGICAL SETUP 50X90 (CUSTOM PROCEDURE TRAY) ×3 IMPLANT
PAD ARMBOARD 7.5X6 YLW CONV (MISCELLANEOUS) ×6 IMPLANT
PENCIL BUTTON HOLSTER BLD 10FT (ELECTRODE) ×3 IMPLANT
SPECIMEN JAR SMALL (MISCELLANEOUS) ×3 IMPLANT
SPONGE LAP 18X18 X RAY DECT (DISPOSABLE) IMPLANT
SPONGE LAP 4X18 X RAY DECT (DISPOSABLE) IMPLANT
STOCKINETTE IMPERVIOUS LG (DRAPES) ×3 IMPLANT
STRIP CLOSURE SKIN 1/2X4 (GAUZE/BANDAGES/DRESSINGS) IMPLANT
SUT MON AB 4-0 PC3 18 (SUTURE) ×3 IMPLANT
SUT VIC AB 3-0 SH 27 (SUTURE) ×2
SUT VIC AB 3-0 SH 27X BRD (SUTURE) ×1 IMPLANT
SYR BULB 3OZ (MISCELLANEOUS) ×3 IMPLANT
SYR CONTROL 10ML LL (SYRINGE) ×3 IMPLANT
TOWEL OR 17X24 6PK STRL BLUE (TOWEL DISPOSABLE) ×3 IMPLANT
TUBE CONNECTING 12'X1/4 (SUCTIONS)
TUBE CONNECTING 12X1/4 (SUCTIONS) IMPLANT
YANKAUER SUCT BULB TIP NO VENT (SUCTIONS) IMPLANT

## 2017-11-02 NOTE — Anesthesia Postprocedure Evaluation (Signed)
Anesthesia Post Note  Patient: Sandy Davis  Procedure(s) Performed: EXCISION OF RIGHT LEG MASS (Right Leg Lower)     Patient location during evaluation: PACU Anesthesia Type: General Level of consciousness: awake and alert Pain management: pain level controlled Vital Signs Assessment: post-procedure vital signs reviewed and stable Respiratory status: spontaneous breathing, nonlabored ventilation and respiratory function stable Cardiovascular status: blood pressure returned to baseline and stable Postop Assessment: no apparent nausea or vomiting Anesthetic complications: no    Last Vitals:  Vitals:   11/02/17 0917 11/02/17 0930  BP: 133/90 116/83  Pulse: (!) 56 66  Resp: 15 (!) 23  Temp: (!) 36.1 C   SpO2: 90% 94%    Last Pain:  Vitals:   11/02/17 1000  TempSrc:   PainSc: 0-No pain                 Lynda Rainwater

## 2017-11-02 NOTE — Interval H&P Note (Signed)
History and Physical Interval Note:  11/02/2017 7:36 AM  Sandy Davis  has presented today for surgery, with the diagnosis of RIGHT LEG MASS POSSIBLE SARCOMA  The various methods of treatment have been discussed with the patient and family. After consideration of risks, benefits and other options for treatment, the patient has consented to  Procedure(s): EXCISION OF RIGHT LEG MASS (Right) as a surgical intervention .  The patient's history has been reviewed, patient examined, no change in status, stable for surgery.  I have reviewed the patient's chart and labs.  Questions were answered to the patient's satisfaction.     Stark Klein

## 2017-11-02 NOTE — Op Note (Signed)
PRE-OPERATIVE DIAGNOSIS: Rapidly growing lower leg mass, concern for sarcoma  POST-OPERATIVE DIAGNOSIS:  Same  PROCEDURE:  Procedure(s): Radical excision of right lower leg mass, 3 x 3 x 4 cm including some adjacent subcutaneous fat, skin, and underlying fascia  SURGEON:  Surgeon(s): Stark Klein, MD  Assistant: Judyann Munson, RNFA  ANESTHESIA:   general  DRAINS: none   LOCAL MEDICATIONS USED:  BUPIVICAINE  and LIDOCAINE   SPECIMEN:   1.  Right lower leg mass with adjacent subcutaneous fat and underlying fascia. 2.  Anterior margin (skin, technically superficial margin)  DISPOSITION OF SPECIMEN:  PATHOLOGY  COUNTS:  YES  DICTATION: .Dragon Dictation  PLAN OF CARE: Admit to inpatient   PATIENT DISPOSITION:  PACU - hemodynamically stable.  FINDINGS:  Very firm mass close to to skin and adherent to underlying fascia  EBL: min  PROCEDURE:  Patient was identified in the holding area and taken to the operating room where she was placed supine on the operating room table.  General anesthesia was induced.  Her right leg was prepped and draped in sterile fashion.  A timeout was performed according to the surgical safety checklist.  When all was correct, we continued.  A longitudinal incision was drawn over the mass.  The skin was incised with a #10 blade.  Skin hooks were used to help with creation of flaps around the mass.  The mass was close to the skin, but not invading the skin.  The subcutaneous fat that was adjacent to the mass was included.  As dissection was continued, it was apparent that there was some underlying fascia adherent to the mass.  It did not appear that any muscle was involved.  The fascia was taken with the mass.  The mass was marked with a margin marker paint kit shift.    There was a significant cavity left.  The skin overlying the mass was quite thinned out.  This was excised in elliptical fashion using the black handle scissors.  The skin was marked as  the superficial margin and was sent for pathology.  The skin was then reapproximated using interrupted 3-0 Vicryl deep dermal sutures and 4-0 Monocryl running subcuticular suture.  The incision was cleaned, dried, and dressed with benzoin, Steri-Strips, gauze, Tegaderm, and Coban.  The patient tolerated the procedure well.  She was allowed to emerge from anesthesia and was taken to the PACU in stable condition.  Needle, sponge, and instrument counts were correct x2.

## 2017-11-02 NOTE — Discharge Instructions (Addendum)
[facility] °Office Phone Number [phone] ° ° POST OP INSTRUCTIONS ° °Always review your discharge instruction sheet given to you by the facility where your surgery was performed. ° °IF YOU HAVE DISABILITY OR FAMILY LEAVE FORMS, YOU MUST BRING THEM TO THE OFFICE FOR PROCESSING.  DO NOT GIVE THEM TO YOUR DOCTOR. ° °1. A prescription for pain medication may be given to you upon discharge.  Take your pain medication as prescribed, if needed.  If narcotic pain medicine is not needed, then you may take acetaminophen (Tylenol) or ibuprofen (Advil) as needed. °2. Take your usually prescribed medications unless otherwise directed °3. If you need a refill on your pain medication, please contact your pharmacy.  They will contact our office to request authorization.  Prescriptions will not be filled after 5pm or on week-ends. °4. You should eat very light the first 24 hours after surgery, such as soup, crackers, pudding, etc.  Resume your normal diet the day after surgery °5. It is common to experience some constipation if taking pain medication after surgery.  Increasing fluid intake and taking a stool softener will usually help or prevent this problem from occurring.  A mild laxative (Milk of Magnesia or Miralax) should be taken according to package directions if there are no bowel movements after 48 hours. °6. You may shower in 48 hours.  The surgical glue will flake off in 2-3 weeks.   °7. ACTIVITIES:  No strenuous activity or heavy lifting for 1 week.   °a. You may drive when you no longer are taking prescription pain medication, you can comfortably wear a seatbelt, and you can safely maneuver your car and apply brakes. °b. RETURN TO WORK:  __________to be determined._______________ °You should see your doctor in the office for a follow-up appointment approximately three-four weeks after your surgery.   ° °WHEN TO CALL YOUR DOCTOR: °1. Fever over 101.0 °2. Nausea and/or vomiting. °3. Extreme swelling  or bruising. °4. Continued bleeding from incision. °5. Increased pain, redness, or drainage from the incision. ° °The clinic staff is available to answer your questions during regular business hours.  Please don’t hesitate to call and ask to speak to one of the nurses for clinical concerns.  If you have a medical emergency, go to the nearest emergency room or call 911.  A surgeon from [facility] is always on call at the hospital. ° °For further questions, please visit [email or web address]  ° °

## 2017-11-02 NOTE — Anesthesia Procedure Notes (Signed)
Procedure Name: LMA Insertion Date/Time: 11/02/2017 7:52 AM Performed by: Lieutenant Diego, CRNA Pre-anesthesia Checklist: Patient identified, Emergency Drugs available, Suction available and Patient being monitored Patient Re-evaluated:Patient Re-evaluated prior to induction Oxygen Delivery Method: Circle system utilized Preoxygenation: Pre-oxygenation with 100% oxygen Induction Type: IV induction Ventilation: Mask ventilation without difficulty LMA: LMA inserted LMA Size: 4.0 Number of attempts: 1 Placement Confirmation: positive ETCO2 and breath sounds checked- equal and bilateral Tube secured with: Tape Dental Injury: Teeth and Oropharynx as per pre-operative assessment

## 2017-11-02 NOTE — Anesthesia Preprocedure Evaluation (Addendum)
Anesthesia Evaluation  Patient identified by MRN, date of birth, ID band Patient awake    Reviewed: Allergy & Precautions, NPO status , Patient's Chart, lab work & pertinent test results  Airway Mallampati: II  TM Distance: >3 FB Neck ROM: Full    Dental no notable dental hx. (+) Poor Dentition, Missing   Pulmonary neg pulmonary ROS, sleep apnea , former smoker,    Pulmonary exam normal breath sounds clear to auscultation       Cardiovascular hypertension, Pt. on medications negative cardio ROS Normal cardiovascular exam Rhythm:Regular Rate:Normal     Neuro/Psych Depression CVA negative neurological ROS  negative psych ROS   GI/Hepatic negative GI ROS, Neg liver ROS,   Endo/Other  negative endocrine ROSdiabetes, Type 2, Insulin Dependent, Oral Hypoglycemic Agents  Renal/GU negative Renal ROS  negative genitourinary   Musculoskeletal negative musculoskeletal ROS (+)   Abdominal   Peds negative pediatric ROS (+)  Hematology negative hematology ROS (+)   Anesthesia Other Findings   Reproductive/Obstetrics negative OB ROS                            Anesthesia Physical Anesthesia Plan  ASA: III  Anesthesia Plan: General   Post-op Pain Management:    Induction: Intravenous  PONV Risk Score and Plan: 3 and Ondansetron, Dexamethasone and Midazolam  Airway Management Planned: Oral ETT  Additional Equipment:   Intra-op Plan:   Post-operative Plan: Extubation in OR  Informed Consent: I have reviewed the patients History and Physical, chart, labs and discussed the procedure including the risks, benefits and alternatives for the proposed anesthesia with the patient or authorized representative who has indicated his/her understanding and acceptance.   Dental advisory given  Plan Discussed with: CRNA  Anesthesia Plan Comments:         Anesthesia Quick Evaluation

## 2017-11-02 NOTE — Transfer of Care (Signed)
Immediate Anesthesia Transfer of Care Note  Patient: Sandy Davis  Procedure(s) Performed: EXCISION OF RIGHT LEG MASS (Right Leg Lower)  Patient Location: PACU  Anesthesia Type:General  Level of Consciousness: awake  Airway & Oxygen Therapy: Patient Spontanous Breathing and Patient connected to face mask oxygen  Post-op Assessment: Report given to RN and Post -op Vital signs reviewed and stable  Post vital signs: Reviewed and stable  Last Vitals:  Vitals Value Taken Time  BP 145/80 11/02/2017  9:00 AM  Temp    Pulse 71 11/02/2017  9:01 AM  Resp 18 11/02/2017  9:01 AM  SpO2 100 % 11/02/2017  9:01 AM  Vitals shown include unvalidated device data.  Last Pain:  Vitals:   11/02/17 0647  TempSrc:   PainSc: 0-No pain      Patients Stated Pain Goal: 3 (36/14/43 1540)  Complications: No apparent anesthesia complications

## 2017-11-03 ENCOUNTER — Encounter (HOSPITAL_COMMUNITY): Payer: Self-pay | Admitting: General Surgery

## 2017-11-06 DIAGNOSIS — I6330 Cerebral infarction due to thrombosis of unspecified cerebral artery: Secondary | ICD-10-CM | POA: Diagnosis not present

## 2017-11-06 DIAGNOSIS — E119 Type 2 diabetes mellitus without complications: Secondary | ICD-10-CM | POA: Diagnosis not present

## 2017-11-06 DIAGNOSIS — E785 Hyperlipidemia, unspecified: Secondary | ICD-10-CM | POA: Diagnosis not present

## 2017-11-06 DIAGNOSIS — I10 Essential (primary) hypertension: Secondary | ICD-10-CM | POA: Diagnosis not present

## 2017-11-07 DIAGNOSIS — S52572A Other intraarticular fracture of lower end of left radius, initial encounter for closed fracture: Secondary | ICD-10-CM | POA: Diagnosis not present

## 2017-11-07 NOTE — Progress Notes (Signed)
Please let patient know pathology is benign.

## 2017-11-21 DIAGNOSIS — S52572A Other intraarticular fracture of lower end of left radius, initial encounter for closed fracture: Secondary | ICD-10-CM | POA: Diagnosis not present

## 2017-11-21 DIAGNOSIS — R202 Paresthesia of skin: Secondary | ICD-10-CM | POA: Diagnosis not present

## 2017-11-21 DIAGNOSIS — Z8781 Personal history of (healed) traumatic fracture: Secondary | ICD-10-CM | POA: Diagnosis not present

## 2017-11-21 DIAGNOSIS — Z8673 Personal history of transient ischemic attack (TIA), and cerebral infarction without residual deficits: Secondary | ICD-10-CM | POA: Diagnosis not present

## 2017-12-06 DIAGNOSIS — E039 Hypothyroidism, unspecified: Secondary | ICD-10-CM | POA: Diagnosis not present

## 2017-12-06 DIAGNOSIS — E785 Hyperlipidemia, unspecified: Secondary | ICD-10-CM | POA: Diagnosis not present

## 2017-12-06 DIAGNOSIS — E0840 Diabetes mellitus due to underlying condition with diabetic neuropathy, unspecified: Secondary | ICD-10-CM | POA: Diagnosis not present

## 2017-12-06 DIAGNOSIS — E119 Type 2 diabetes mellitus without complications: Secondary | ICD-10-CM | POA: Diagnosis not present

## 2017-12-06 DIAGNOSIS — R413 Other amnesia: Secondary | ICD-10-CM | POA: Diagnosis not present

## 2017-12-06 DIAGNOSIS — I10 Essential (primary) hypertension: Secondary | ICD-10-CM | POA: Diagnosis not present

## 2017-12-06 DIAGNOSIS — I6330 Cerebral infarction due to thrombosis of unspecified cerebral artery: Secondary | ICD-10-CM | POA: Diagnosis not present

## 2017-12-06 DIAGNOSIS — G4733 Obstructive sleep apnea (adult) (pediatric): Secondary | ICD-10-CM | POA: Diagnosis not present

## 2017-12-21 DIAGNOSIS — H25813 Combined forms of age-related cataract, bilateral: Secondary | ICD-10-CM | POA: Diagnosis not present

## 2017-12-21 DIAGNOSIS — H16223 Keratoconjunctivitis sicca, not specified as Sjogren's, bilateral: Secondary | ICD-10-CM | POA: Diagnosis not present

## 2017-12-21 DIAGNOSIS — E119 Type 2 diabetes mellitus without complications: Secondary | ICD-10-CM | POA: Diagnosis not present

## 2017-12-21 DIAGNOSIS — H01022 Squamous blepharitis right lower eyelid: Secondary | ICD-10-CM | POA: Diagnosis not present

## 2017-12-21 DIAGNOSIS — H01021 Squamous blepharitis right upper eyelid: Secondary | ICD-10-CM | POA: Diagnosis not present

## 2017-12-29 ENCOUNTER — Encounter: Payer: Self-pay | Admitting: Psychology

## 2017-12-29 ENCOUNTER — Encounter: Payer: Medicare Other | Attending: Psychology | Admitting: Psychology

## 2017-12-29 DIAGNOSIS — Z8673 Personal history of transient ischemic attack (TIA), and cerebral infarction without residual deficits: Secondary | ICD-10-CM | POA: Diagnosis not present

## 2017-12-29 DIAGNOSIS — F329 Major depressive disorder, single episode, unspecified: Secondary | ICD-10-CM | POA: Insufficient documentation

## 2017-12-29 DIAGNOSIS — R413 Other amnesia: Secondary | ICD-10-CM | POA: Insufficient documentation

## 2017-12-29 DIAGNOSIS — G4733 Obstructive sleep apnea (adult) (pediatric): Secondary | ICD-10-CM | POA: Insufficient documentation

## 2017-12-29 DIAGNOSIS — E119 Type 2 diabetes mellitus without complications: Secondary | ICD-10-CM | POA: Insufficient documentation

## 2017-12-29 DIAGNOSIS — R269 Unspecified abnormalities of gait and mobility: Secondary | ICD-10-CM | POA: Diagnosis not present

## 2017-12-29 DIAGNOSIS — F419 Anxiety disorder, unspecified: Secondary | ICD-10-CM | POA: Insufficient documentation

## 2017-12-29 DIAGNOSIS — Z8249 Family history of ischemic heart disease and other diseases of the circulatory system: Secondary | ICD-10-CM | POA: Insufficient documentation

## 2017-12-29 DIAGNOSIS — R454 Irritability and anger: Secondary | ICD-10-CM | POA: Insufficient documentation

## 2017-12-29 DIAGNOSIS — G479 Sleep disorder, unspecified: Secondary | ICD-10-CM | POA: Diagnosis not present

## 2017-12-29 NOTE — Progress Notes (Signed)
Sandy Davis is a 62 year old female who was referred by Dr. Criss Rosales because of increasing memory and other cognitive deficits.  The patient does have a history of stroke approximately 4 years ago but the patient was experiencing increasing and worsening memory issues when she was initially referred.  The patient was reduced on her Neurontin and and has now completely stopped taking it with significant improvements in her memory and cognitive functioning.  The patient has had a number of significantly stressful issues since our last visit.  The patient had a what is now known as a benign growth in the back of her right calf muscle that has been surgically removed.  Pathology showed it to be benign.  The patient also broke her left wrist and has had some nerve damage with abnormal nerve conduction studies.  The patient reports that things have settled down versus these acute symptoms and difficulties that happened several months ago.  Today we worked on coping strategies and adaptive functions around her issues of her residual effects from her stroke in the recent medical issues that triggered a lot of worry and distress initially.  The patient will return in 6 months for follow-up.

## 2018-02-01 DIAGNOSIS — H25813 Combined forms of age-related cataract, bilateral: Secondary | ICD-10-CM | POA: Diagnosis not present

## 2018-02-01 DIAGNOSIS — H01025 Squamous blepharitis left lower eyelid: Secondary | ICD-10-CM | POA: Diagnosis not present

## 2018-02-01 DIAGNOSIS — H01021 Squamous blepharitis right upper eyelid: Secondary | ICD-10-CM | POA: Diagnosis not present

## 2018-02-01 DIAGNOSIS — H01022 Squamous blepharitis right lower eyelid: Secondary | ICD-10-CM | POA: Diagnosis not present

## 2018-02-01 DIAGNOSIS — H01024 Squamous blepharitis left upper eyelid: Secondary | ICD-10-CM | POA: Diagnosis not present

## 2018-03-08 DIAGNOSIS — E785 Hyperlipidemia, unspecified: Secondary | ICD-10-CM | POA: Diagnosis not present

## 2018-03-08 DIAGNOSIS — I10 Essential (primary) hypertension: Secondary | ICD-10-CM | POA: Diagnosis not present

## 2018-03-08 DIAGNOSIS — E1100 Type 2 diabetes mellitus with hyperosmolarity without nonketotic hyperglycemic-hyperosmolar coma (NKHHC): Secondary | ICD-10-CM | POA: Diagnosis not present

## 2018-03-08 DIAGNOSIS — I639 Cerebral infarction, unspecified: Secondary | ICD-10-CM | POA: Diagnosis not present

## 2018-04-05 DIAGNOSIS — Z0000 Encounter for general adult medical examination without abnormal findings: Secondary | ICD-10-CM | POA: Diagnosis not present

## 2018-05-01 DIAGNOSIS — H10413 Chronic giant papillary conjunctivitis, bilateral: Secondary | ICD-10-CM | POA: Diagnosis not present

## 2018-05-01 DIAGNOSIS — E119 Type 2 diabetes mellitus without complications: Secondary | ICD-10-CM | POA: Diagnosis not present

## 2018-05-01 DIAGNOSIS — H01022 Squamous blepharitis right lower eyelid: Secondary | ICD-10-CM | POA: Diagnosis not present

## 2018-05-01 DIAGNOSIS — H01021 Squamous blepharitis right upper eyelid: Secondary | ICD-10-CM | POA: Diagnosis not present

## 2018-05-01 DIAGNOSIS — H25813 Combined forms of age-related cataract, bilateral: Secondary | ICD-10-CM | POA: Diagnosis not present

## 2018-05-21 ENCOUNTER — Other Ambulatory Visit: Payer: Self-pay | Admitting: Neurology

## 2018-06-12 DIAGNOSIS — I10 Essential (primary) hypertension: Secondary | ICD-10-CM | POA: Diagnosis not present

## 2018-06-12 DIAGNOSIS — E119 Type 2 diabetes mellitus without complications: Secondary | ICD-10-CM | POA: Diagnosis not present

## 2018-06-12 DIAGNOSIS — G4733 Obstructive sleep apnea (adult) (pediatric): Secondary | ICD-10-CM | POA: Diagnosis not present

## 2018-06-12 DIAGNOSIS — I6330 Cerebral infarction due to thrombosis of unspecified cerebral artery: Secondary | ICD-10-CM | POA: Diagnosis not present

## 2018-07-03 ENCOUNTER — Encounter: Payer: Medicare Other | Attending: Psychology | Admitting: Psychology

## 2018-12-11 DIAGNOSIS — E039 Hypothyroidism, unspecified: Secondary | ICD-10-CM | POA: Diagnosis not present

## 2018-12-11 DIAGNOSIS — I10 Essential (primary) hypertension: Secondary | ICD-10-CM | POA: Diagnosis not present

## 2018-12-11 DIAGNOSIS — E1169 Type 2 diabetes mellitus with other specified complication: Secondary | ICD-10-CM | POA: Diagnosis not present

## 2018-12-11 DIAGNOSIS — E782 Mixed hyperlipidemia: Secondary | ICD-10-CM | POA: Diagnosis not present

## 2018-12-11 DIAGNOSIS — G4700 Insomnia, unspecified: Secondary | ICD-10-CM | POA: Diagnosis not present

## 2019-01-02 DIAGNOSIS — I10 Essential (primary) hypertension: Secondary | ICD-10-CM | POA: Diagnosis not present

## 2019-01-02 DIAGNOSIS — E039 Hypothyroidism, unspecified: Secondary | ICD-10-CM | POA: Diagnosis not present

## 2019-01-02 DIAGNOSIS — G4733 Obstructive sleep apnea (adult) (pediatric): Secondary | ICD-10-CM | POA: Diagnosis not present

## 2019-01-02 DIAGNOSIS — E785 Hyperlipidemia, unspecified: Secondary | ICD-10-CM | POA: Diagnosis not present

## 2019-01-02 DIAGNOSIS — E1169 Type 2 diabetes mellitus with other specified complication: Secondary | ICD-10-CM | POA: Diagnosis not present

## 2019-05-16 DIAGNOSIS — E1169 Type 2 diabetes mellitus with other specified complication: Secondary | ICD-10-CM | POA: Diagnosis not present

## 2019-05-16 DIAGNOSIS — Z0000 Encounter for general adult medical examination without abnormal findings: Secondary | ICD-10-CM | POA: Diagnosis not present

## 2019-05-16 DIAGNOSIS — G4700 Insomnia, unspecified: Secondary | ICD-10-CM | POA: Diagnosis not present

## 2019-05-16 DIAGNOSIS — I10 Essential (primary) hypertension: Secondary | ICD-10-CM | POA: Diagnosis not present

## 2019-05-16 DIAGNOSIS — E785 Hyperlipidemia, unspecified: Secondary | ICD-10-CM | POA: Diagnosis not present

## 2019-05-16 DIAGNOSIS — E039 Hypothyroidism, unspecified: Secondary | ICD-10-CM | POA: Diagnosis not present

## 2019-06-06 DIAGNOSIS — I10 Essential (primary) hypertension: Secondary | ICD-10-CM | POA: Diagnosis not present

## 2019-06-06 DIAGNOSIS — E1169 Type 2 diabetes mellitus with other specified complication: Secondary | ICD-10-CM | POA: Diagnosis not present

## 2019-06-26 DIAGNOSIS — H0102A Squamous blepharitis right eye, upper and lower eyelids: Secondary | ICD-10-CM | POA: Diagnosis not present

## 2019-06-26 DIAGNOSIS — H25813 Combined forms of age-related cataract, bilateral: Secondary | ICD-10-CM | POA: Diagnosis not present

## 2019-06-26 DIAGNOSIS — E119 Type 2 diabetes mellitus without complications: Secondary | ICD-10-CM | POA: Diagnosis not present

## 2019-06-26 DIAGNOSIS — H16223 Keratoconjunctivitis sicca, not specified as Sjogren's, bilateral: Secondary | ICD-10-CM | POA: Diagnosis not present

## 2019-06-26 DIAGNOSIS — Z794 Long term (current) use of insulin: Secondary | ICD-10-CM | POA: Diagnosis not present

## 2020-01-01 DIAGNOSIS — G4733 Obstructive sleep apnea (adult) (pediatric): Secondary | ICD-10-CM | POA: Diagnosis not present

## 2020-01-01 DIAGNOSIS — I6330 Cerebral infarction due to thrombosis of unspecified cerebral artery: Secondary | ICD-10-CM | POA: Diagnosis not present

## 2020-01-01 DIAGNOSIS — E1169 Type 2 diabetes mellitus with other specified complication: Secondary | ICD-10-CM | POA: Diagnosis not present

## 2020-01-01 DIAGNOSIS — E785 Hyperlipidemia, unspecified: Secondary | ICD-10-CM | POA: Diagnosis not present

## 2020-01-01 DIAGNOSIS — E0940 Drug or chemical induced diabetes mellitus with neurological complications with diabetic neuropathy, unspecified: Secondary | ICD-10-CM | POA: Diagnosis not present

## 2020-01-07 DIAGNOSIS — H5213 Myopia, bilateral: Secondary | ICD-10-CM | POA: Diagnosis not present

## 2020-01-23 DIAGNOSIS — E785 Hyperlipidemia, unspecified: Secondary | ICD-10-CM | POA: Diagnosis not present

## 2020-01-23 DIAGNOSIS — E039 Hypothyroidism, unspecified: Secondary | ICD-10-CM | POA: Diagnosis not present

## 2020-01-23 DIAGNOSIS — E1169 Type 2 diabetes mellitus with other specified complication: Secondary | ICD-10-CM | POA: Diagnosis not present

## 2020-01-23 DIAGNOSIS — I10 Essential (primary) hypertension: Secondary | ICD-10-CM | POA: Diagnosis not present

## 2020-01-23 DIAGNOSIS — I6330 Cerebral infarction due to thrombosis of unspecified cerebral artery: Secondary | ICD-10-CM | POA: Diagnosis not present

## 2020-01-31 DIAGNOSIS — I10 Essential (primary) hypertension: Secondary | ICD-10-CM | POA: Diagnosis not present

## 2020-01-31 DIAGNOSIS — E785 Hyperlipidemia, unspecified: Secondary | ICD-10-CM | POA: Diagnosis not present

## 2020-01-31 DIAGNOSIS — E1110 Type 2 diabetes mellitus with ketoacidosis without coma: Secondary | ICD-10-CM | POA: Diagnosis not present

## 2020-04-02 DIAGNOSIS — E7800 Pure hypercholesterolemia, unspecified: Secondary | ICD-10-CM | POA: Diagnosis not present

## 2020-04-02 DIAGNOSIS — E1169 Type 2 diabetes mellitus with other specified complication: Secondary | ICD-10-CM | POA: Diagnosis not present

## 2020-04-02 DIAGNOSIS — Z23 Encounter for immunization: Secondary | ICD-10-CM | POA: Diagnosis not present

## 2020-04-02 DIAGNOSIS — I10 Essential (primary) hypertension: Secondary | ICD-10-CM | POA: Diagnosis not present

## 2020-05-29 DIAGNOSIS — Z20822 Contact with and (suspected) exposure to covid-19: Secondary | ICD-10-CM | POA: Diagnosis not present

## 2020-06-02 DIAGNOSIS — E1110 Type 2 diabetes mellitus with ketoacidosis without coma: Secondary | ICD-10-CM | POA: Diagnosis not present

## 2020-06-02 DIAGNOSIS — E785 Hyperlipidemia, unspecified: Secondary | ICD-10-CM | POA: Diagnosis not present

## 2020-06-02 DIAGNOSIS — I10 Essential (primary) hypertension: Secondary | ICD-10-CM | POA: Diagnosis not present

## 2020-06-19 DIAGNOSIS — E039 Hypothyroidism, unspecified: Secondary | ICD-10-CM | POA: Diagnosis not present

## 2020-06-19 DIAGNOSIS — E1169 Type 2 diabetes mellitus with other specified complication: Secondary | ICD-10-CM | POA: Diagnosis not present

## 2020-06-19 DIAGNOSIS — I6330 Cerebral infarction due to thrombosis of unspecified cerebral artery: Secondary | ICD-10-CM | POA: Diagnosis not present

## 2020-06-25 DIAGNOSIS — H10413 Chronic giant papillary conjunctivitis, bilateral: Secondary | ICD-10-CM | POA: Diagnosis not present

## 2020-06-25 DIAGNOSIS — H5213 Myopia, bilateral: Secondary | ICD-10-CM | POA: Diagnosis not present

## 2020-06-25 DIAGNOSIS — E119 Type 2 diabetes mellitus without complications: Secondary | ICD-10-CM | POA: Diagnosis not present

## 2020-06-25 DIAGNOSIS — H25813 Combined forms of age-related cataract, bilateral: Secondary | ICD-10-CM | POA: Diagnosis not present

## 2020-06-25 DIAGNOSIS — H0102B Squamous blepharitis left eye, upper and lower eyelids: Secondary | ICD-10-CM | POA: Diagnosis not present

## 2020-06-25 DIAGNOSIS — H0102A Squamous blepharitis right eye, upper and lower eyelids: Secondary | ICD-10-CM | POA: Diagnosis not present

## 2020-11-11 ENCOUNTER — Ambulatory Visit: Payer: Medicare Other | Admitting: Podiatry

## 2020-11-17 ENCOUNTER — Ambulatory Visit (INDEPENDENT_AMBULATORY_CARE_PROVIDER_SITE_OTHER): Payer: 59

## 2020-11-17 ENCOUNTER — Other Ambulatory Visit: Payer: Self-pay

## 2020-11-17 ENCOUNTER — Ambulatory Visit (INDEPENDENT_AMBULATORY_CARE_PROVIDER_SITE_OTHER): Payer: 59 | Admitting: Podiatry

## 2020-11-17 DIAGNOSIS — M722 Plantar fascial fibromatosis: Secondary | ICD-10-CM

## 2020-11-17 DIAGNOSIS — E1141 Type 2 diabetes mellitus with diabetic mononeuropathy: Secondary | ICD-10-CM | POA: Diagnosis not present

## 2020-11-17 DIAGNOSIS — G5751 Tarsal tunnel syndrome, right lower limb: Secondary | ICD-10-CM | POA: Diagnosis not present

## 2020-11-17 DIAGNOSIS — Z794 Long term (current) use of insulin: Secondary | ICD-10-CM | POA: Diagnosis not present

## 2020-11-18 ENCOUNTER — Encounter: Payer: Self-pay | Admitting: Podiatry
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
## 2020-12-04 ENCOUNTER — Other Ambulatory Visit: Payer: Self-pay

## 2020-12-04 ENCOUNTER — Ambulatory Visit (INDEPENDENT_AMBULATORY_CARE_PROVIDER_SITE_OTHER): Payer: 59 | Admitting: Podiatry

## 2020-12-04 DIAGNOSIS — Z794 Long term (current) use of insulin: Secondary | ICD-10-CM

## 2020-12-04 DIAGNOSIS — G5751 Tarsal tunnel syndrome, right lower limb: Secondary | ICD-10-CM | POA: Diagnosis not present

## 2020-12-04 DIAGNOSIS — E1141 Type 2 diabetes mellitus with diabetic mononeuropathy: Secondary | ICD-10-CM | POA: Diagnosis not present

## 2020-12-04 DIAGNOSIS — Z01818 Encounter for other preprocedural examination: Secondary | ICD-10-CM

## 2020-12-08 ENCOUNTER — Encounter: Payer: Self-pay | Admitting: Podiatry
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
## 2021-01-26 DIAGNOSIS — Z87891 Personal history of nicotine dependence: Secondary | ICD-10-CM | POA: Diagnosis not present

## 2021-01-26 DIAGNOSIS — H6123 Impacted cerumen, bilateral: Secondary | ICD-10-CM | POA: Diagnosis not present

## 2021-01-26 DIAGNOSIS — I10 Essential (primary) hypertension: Secondary | ICD-10-CM | POA: Diagnosis not present

## 2021-01-26 DIAGNOSIS — H905 Unspecified sensorineural hearing loss: Secondary | ICD-10-CM | POA: Diagnosis not present

## 2021-01-26 DIAGNOSIS — E782 Mixed hyperlipidemia: Secondary | ICD-10-CM | POA: Diagnosis not present

## 2021-01-26 DIAGNOSIS — E1169 Type 2 diabetes mellitus with other specified complication: Secondary | ICD-10-CM | POA: Diagnosis not present

## 2021-01-26 DIAGNOSIS — I6330 Cerebral infarction due to thrombosis of unspecified cerebral artery: Secondary | ICD-10-CM | POA: Diagnosis not present

## 2021-01-26 DIAGNOSIS — E038 Other specified hypothyroidism: Secondary | ICD-10-CM | POA: Diagnosis not present

## 2021-03-03 DIAGNOSIS — I10 Essential (primary) hypertension: Secondary | ICD-10-CM | POA: Diagnosis not present

## 2021-03-03 DIAGNOSIS — E785 Hyperlipidemia, unspecified: Secondary | ICD-10-CM | POA: Diagnosis not present

## 2021-03-03 DIAGNOSIS — E1110 Type 2 diabetes mellitus with ketoacidosis without coma: Secondary | ICD-10-CM | POA: Diagnosis not present

## 2021-03-05 ENCOUNTER — Telehealth: Payer: Self-pay | Admitting: Urology

## 2021-03-05 NOTE — Telephone Encounter (Signed)
DOS - 03/15/21  TARSAL TUNNEL RELEASE RIGHT --- [id number]  [employer] EFFECTIVE DATE - 01/01/21   PLAN DEDUCTIBLE - $233.00 W/ $177.29 REMAINING OUT OF POCKET - $0.00 COINSURANCE - 20%  COPAY - $0.00   SPOKE WITH JAY D WITH [employer] AND SHE STATED THAT WITH CPT CODE 42595 NO PRIOR AUTH IS REQUIRED.  REF # [id number]

## 2021-03-23 ENCOUNTER — Other Ambulatory Visit (HOSPITAL_COMMUNITY): Payer: Self-pay | Admitting: Family Medicine

## 2021-03-23 ENCOUNTER — Encounter (HOSPITAL_COMMUNITY): Payer: Medicare Other

## 2021-03-23 DIAGNOSIS — I10 Essential (primary) hypertension: Secondary | ICD-10-CM | POA: Diagnosis not present

## 2021-03-23 DIAGNOSIS — N3281 Overactive bladder: Secondary | ICD-10-CM | POA: Diagnosis not present

## 2021-03-23 DIAGNOSIS — R2241 Localized swelling, mass and lump, right lower limb: Secondary | ICD-10-CM | POA: Diagnosis not present

## 2021-03-23 DIAGNOSIS — I739 Peripheral vascular disease, unspecified: Secondary | ICD-10-CM | POA: Diagnosis not present

## 2021-03-23 DIAGNOSIS — E1169 Type 2 diabetes mellitus with other specified complication: Secondary | ICD-10-CM | POA: Diagnosis not present

## 2021-03-23 DIAGNOSIS — R52 Pain, unspecified: Secondary | ICD-10-CM

## 2021-03-24 ENCOUNTER — Encounter: Payer: Medicare HMO | Admitting: Podiatry

## 2021-03-24 DIAGNOSIS — M7989 Other specified soft tissue disorders: Secondary | ICD-10-CM | POA: Diagnosis not present

## 2021-03-24 DIAGNOSIS — I83893 Varicose veins of bilateral lower extremities with other complications: Secondary | ICD-10-CM | POA: Diagnosis not present

## 2021-04-06 DIAGNOSIS — I739 Peripheral vascular disease, unspecified: Secondary | ICD-10-CM | POA: Diagnosis not present

## 2021-04-06 DIAGNOSIS — E785 Hyperlipidemia, unspecified: Secondary | ICD-10-CM | POA: Diagnosis not present

## 2021-04-06 DIAGNOSIS — E1169 Type 2 diabetes mellitus with other specified complication: Secondary | ICD-10-CM | POA: Diagnosis not present

## 2021-04-07 ENCOUNTER — Encounter: Payer: 59 | Admitting: Podiatry

## 2021-05-03 DIAGNOSIS — E1110 Type 2 diabetes mellitus with ketoacidosis without coma: Secondary | ICD-10-CM | POA: Diagnosis not present

## 2021-05-03 DIAGNOSIS — E785 Hyperlipidemia, unspecified: Secondary | ICD-10-CM | POA: Diagnosis not present

## 2021-05-03 DIAGNOSIS — I10 Essential (primary) hypertension: Secondary | ICD-10-CM | POA: Diagnosis not present

## 2021-06-11 ENCOUNTER — Encounter (HOSPITAL_COMMUNITY): Payer: Self-pay

## 2021-06-21 ENCOUNTER — Ambulatory Visit (HOSPITAL_COMMUNITY): Payer: Medicare HMO | Attending: Family Medicine

## 2021-07-01 ENCOUNTER — Ambulatory Visit (HOSPITAL_COMMUNITY): Payer: Medicare HMO | Attending: Family Medicine

## 2021-08-02 DIAGNOSIS — I10 Essential (primary) hypertension: Secondary | ICD-10-CM | POA: Diagnosis not present

## 2021-08-02 DIAGNOSIS — I6330 Cerebral infarction due to thrombosis of unspecified cerebral artery: Secondary | ICD-10-CM | POA: Diagnosis not present

## 2021-08-02 DIAGNOSIS — E038 Other specified hypothyroidism: Secondary | ICD-10-CM | POA: Diagnosis not present

## 2021-08-02 DIAGNOSIS — E785 Hyperlipidemia, unspecified: Secondary | ICD-10-CM | POA: Diagnosis not present

## 2021-08-02 DIAGNOSIS — F0390 Unspecified dementia without behavioral disturbance: Secondary | ICD-10-CM | POA: Diagnosis not present

## 2021-08-02 DIAGNOSIS — E1169 Type 2 diabetes mellitus with other specified complication: Secondary | ICD-10-CM | POA: Diagnosis not present

## 2021-08-02 DIAGNOSIS — E7800 Pure hypercholesterolemia, unspecified: Secondary | ICD-10-CM | POA: Diagnosis not present

## 2021-08-02 DIAGNOSIS — R69 Illness, unspecified: Secondary | ICD-10-CM | POA: Diagnosis not present

## 2021-11-08 DIAGNOSIS — E559 Vitamin D deficiency, unspecified: Secondary | ICD-10-CM | POA: Diagnosis not present

## 2021-11-08 DIAGNOSIS — E039 Hypothyroidism, unspecified: Secondary | ICD-10-CM | POA: Diagnosis not present

## 2021-11-08 DIAGNOSIS — E1169 Type 2 diabetes mellitus with other specified complication: Secondary | ICD-10-CM | POA: Diagnosis not present

## 2021-11-08 DIAGNOSIS — I10 Essential (primary) hypertension: Secondary | ICD-10-CM | POA: Diagnosis not present

## 2021-11-08 DIAGNOSIS — Z0000 Encounter for general adult medical examination without abnormal findings: Secondary | ICD-10-CM | POA: Diagnosis not present

## 2021-12-06 DIAGNOSIS — R7611 Nonspecific reaction to tuberculin skin test without active tuberculosis: Secondary | ICD-10-CM | POA: Diagnosis not present

## 2022-02-08 DIAGNOSIS — R410 Disorientation, unspecified: Secondary | ICD-10-CM | POA: Diagnosis not present

## 2022-02-08 DIAGNOSIS — Z1388 Encounter for screening for disorder due to exposure to contaminants: Secondary | ICD-10-CM | POA: Diagnosis not present

## 2022-02-08 DIAGNOSIS — G4733 Obstructive sleep apnea (adult) (pediatric): Secondary | ICD-10-CM | POA: Diagnosis not present

## 2022-02-08 DIAGNOSIS — E785 Hyperlipidemia, unspecified: Secondary | ICD-10-CM | POA: Diagnosis not present

## 2022-02-08 DIAGNOSIS — E1169 Type 2 diabetes mellitus with other specified complication: Secondary | ICD-10-CM | POA: Diagnosis not present

## 2022-02-08 DIAGNOSIS — Z148 Genetic carrier of other disease: Secondary | ICD-10-CM | POA: Diagnosis not present

## 2022-02-08 DIAGNOSIS — I6330 Cerebral infarction due to thrombosis of unspecified cerebral artery: Secondary | ICD-10-CM | POA: Diagnosis not present

## 2022-02-08 DIAGNOSIS — E039 Hypothyroidism, unspecified: Secondary | ICD-10-CM | POA: Diagnosis not present

## 2022-02-09 DIAGNOSIS — Z1388 Encounter for screening for disorder due to exposure to contaminants: Secondary | ICD-10-CM | POA: Diagnosis not present

## 2022-02-09 DIAGNOSIS — E039 Hypothyroidism, unspecified: Secondary | ICD-10-CM | POA: Diagnosis not present

## 2022-02-09 DIAGNOSIS — G4733 Obstructive sleep apnea (adult) (pediatric): Secondary | ICD-10-CM | POA: Diagnosis not present

## 2022-02-09 DIAGNOSIS — Z148 Genetic carrier of other disease: Secondary | ICD-10-CM | POA: Diagnosis not present

## 2022-02-09 DIAGNOSIS — R410 Disorientation, unspecified: Secondary | ICD-10-CM | POA: Diagnosis not present

## 2022-02-09 DIAGNOSIS — E785 Hyperlipidemia, unspecified: Secondary | ICD-10-CM | POA: Diagnosis not present

## 2022-02-09 DIAGNOSIS — E1169 Type 2 diabetes mellitus with other specified complication: Secondary | ICD-10-CM | POA: Diagnosis not present

## 2022-03-15 ENCOUNTER — Telehealth: Payer: Self-pay | Admitting: *Deleted

## 2022-03-15 DIAGNOSIS — E785 Hyperlipidemia, unspecified: Secondary | ICD-10-CM | POA: Diagnosis not present

## 2022-03-15 DIAGNOSIS — E1169 Type 2 diabetes mellitus with other specified complication: Secondary | ICD-10-CM | POA: Diagnosis not present

## 2022-03-15 DIAGNOSIS — Z6830 Body mass index (BMI) 30.0-30.9, adult: Secondary | ICD-10-CM | POA: Diagnosis not present

## 2022-03-15 DIAGNOSIS — F0390 Unspecified dementia without behavioral disturbance: Secondary | ICD-10-CM | POA: Diagnosis not present

## 2022-03-15 DIAGNOSIS — F4323 Adjustment disorder with mixed anxiety and depressed mood: Secondary | ICD-10-CM | POA: Diagnosis not present

## 2022-03-15 DIAGNOSIS — R413 Other amnesia: Secondary | ICD-10-CM | POA: Diagnosis not present

## 2022-03-15 NOTE — Patient Outreach (Signed)
  Care Coordination   03/15/2022 Name: Sandy Davis MRN: [id number] DOB: 1956-06-25   Care Coordination Outreach Attempts:  An unsuccessful telephone outreach was attempted today to offer the patient information about available care coordination services as a benefit of their health plan.   Follow Up Plan:  Additional outreach attempts will be made to offer the patient care coordination information and services.   Encounter Outcome:  No Answer  Care Coordination Interventions Activated:  No   Care Coordination Interventions:  No, not indicated    Emelia Loron RN, BSN [place] [phone] Terrisha [email or web address] ]

## 2022-03-17 ENCOUNTER — Ambulatory Visit: Payer: Self-pay

## 2022-03-17 NOTE — Patient Outreach (Signed)
  Care Coordination   03/17/2022 Name: Sandy Davis MRN: [id number] DOB: [birth year]   Care Coordination Outreach Attempts:  A second unsuccessful outreach was attempted today to offer the patient with information about available care coordination services as a benefit of their health plan.     Follow Up Plan:  Additional outreach attempts will be made to offer the patient care coordination information and services.   Encounter Outcome:  No Answer  Care Coordination Interventions Activated:  No   Care Coordination Interventions:  No, not indicated    Daneen Schick, BSW, CDP Social Worker, Certified Dementia Practitioner [facility] Care Management  Care Coordination [phone]

## 2022-04-12 ENCOUNTER — Ambulatory Visit: Payer: Self-pay

## 2022-04-12 NOTE — Patient Outreach (Signed)
  Care Coordination   Initial Visit Note   04/12/2022 Name: Sandy Davis MRN: [id number] DOB: Dec 27, 1955  Sandy Davis is a 66 y.o. year old female who sees Lucianne Lei, MD for primary care. I spoke with  [name] by phone today.  What matters to the patients health and wellness today?  Patient refused to participate in today's call    Goals Addressed   None     SDOH assessments and interventions completed:  No     Care Coordination Interventions Activated:  No  Care Coordination Interventions:  No, not indicated   Follow up plan: No further intervention required.   Encounter Outcome:  Pt. Refused   Daneen Schick, BSW, CDP Social Worker, Certified Dementia Practitioner [place] Management  Care Coordination [phone]

## 2022-04-26 DIAGNOSIS — I6330 Cerebral infarction due to thrombosis of unspecified cerebral artery: Secondary | ICD-10-CM | POA: Diagnosis not present

## 2022-04-26 DIAGNOSIS — Z23 Encounter for immunization: Secondary | ICD-10-CM | POA: Diagnosis not present

## 2022-04-26 DIAGNOSIS — E1169 Type 2 diabetes mellitus with other specified complication: Secondary | ICD-10-CM | POA: Diagnosis not present

## 2022-04-26 DIAGNOSIS — I10 Essential (primary) hypertension: Secondary | ICD-10-CM | POA: Diagnosis not present

## 2022-05-03 DIAGNOSIS — E1169 Type 2 diabetes mellitus with other specified complication: Secondary | ICD-10-CM | POA: Diagnosis not present

## 2022-05-09 DIAGNOSIS — Z794 Long term (current) use of insulin: Secondary | ICD-10-CM | POA: Diagnosis not present

## 2022-05-09 DIAGNOSIS — H0102A Squamous blepharitis right eye, upper and lower eyelids: Secondary | ICD-10-CM | POA: Diagnosis not present

## 2022-05-09 DIAGNOSIS — H0102B Squamous blepharitis left eye, upper and lower eyelids: Secondary | ICD-10-CM | POA: Diagnosis not present

## 2022-05-09 DIAGNOSIS — H25813 Combined forms of age-related cataract, bilateral: Secondary | ICD-10-CM | POA: Diagnosis not present

## 2022-05-09 DIAGNOSIS — E119 Type 2 diabetes mellitus without complications: Secondary | ICD-10-CM | POA: Diagnosis not present

## 2022-05-09 DIAGNOSIS — H10413 Chronic giant papillary conjunctivitis, bilateral: Secondary | ICD-10-CM | POA: Diagnosis not present

## 2022-05-09 DIAGNOSIS — H04123 Dry eye syndrome of bilateral lacrimal glands: Secondary | ICD-10-CM | POA: Diagnosis not present

## 2022-05-15 DIAGNOSIS — E119 Type 2 diabetes mellitus without complications: Secondary | ICD-10-CM | POA: Diagnosis not present

## 2022-05-15 DIAGNOSIS — Z794 Long term (current) use of insulin: Secondary | ICD-10-CM | POA: Diagnosis not present

## 2022-05-23 DIAGNOSIS — Z794 Long term (current) use of insulin: Secondary | ICD-10-CM | POA: Diagnosis not present

## 2022-05-23 DIAGNOSIS — E119 Type 2 diabetes mellitus without complications: Secondary | ICD-10-CM | POA: Diagnosis not present

## 2022-06-13 DIAGNOSIS — H0102B Squamous blepharitis left eye, upper and lower eyelids: Secondary | ICD-10-CM | POA: Diagnosis not present

## 2022-06-13 DIAGNOSIS — H10413 Chronic giant papillary conjunctivitis, bilateral: Secondary | ICD-10-CM | POA: Diagnosis not present

## 2022-06-13 DIAGNOSIS — H0102A Squamous blepharitis right eye, upper and lower eyelids: Secondary | ICD-10-CM | POA: Diagnosis not present

## 2022-06-13 DIAGNOSIS — H04123 Dry eye syndrome of bilateral lacrimal glands: Secondary | ICD-10-CM | POA: Diagnosis not present

## 2022-06-13 DIAGNOSIS — E119 Type 2 diabetes mellitus without complications: Secondary | ICD-10-CM | POA: Diagnosis not present

## 2022-06-13 DIAGNOSIS — Z794 Long term (current) use of insulin: Secondary | ICD-10-CM | POA: Diagnosis not present

## 2022-06-13 DIAGNOSIS — H25813 Combined forms of age-related cataract, bilateral: Secondary | ICD-10-CM | POA: Diagnosis not present

## 2022-06-16 DIAGNOSIS — Z794 Long term (current) use of insulin: Secondary | ICD-10-CM | POA: Diagnosis not present

## 2022-06-16 DIAGNOSIS — E119 Type 2 diabetes mellitus without complications: Secondary | ICD-10-CM | POA: Diagnosis not present

## 2022-08-17 DIAGNOSIS — H0102B Squamous blepharitis left eye, upper and lower eyelids: Secondary | ICD-10-CM | POA: Diagnosis not present

## 2022-08-17 DIAGNOSIS — H0102A Squamous blepharitis right eye, upper and lower eyelids: Secondary | ICD-10-CM | POA: Diagnosis not present

## 2022-08-17 DIAGNOSIS — Z794 Long term (current) use of insulin: Secondary | ICD-10-CM | POA: Diagnosis not present

## 2022-08-17 DIAGNOSIS — H25812 Combined forms of age-related cataract, left eye: Secondary | ICD-10-CM | POA: Diagnosis not present

## 2022-08-17 DIAGNOSIS — E119 Type 2 diabetes mellitus without complications: Secondary | ICD-10-CM | POA: Diagnosis not present

## 2022-08-17 DIAGNOSIS — H10413 Chronic giant papillary conjunctivitis, bilateral: Secondary | ICD-10-CM | POA: Diagnosis not present

## 2022-08-17 DIAGNOSIS — H04123 Dry eye syndrome of bilateral lacrimal glands: Secondary | ICD-10-CM | POA: Diagnosis not present

## 2022-08-26 DIAGNOSIS — H25812 Combined forms of age-related cataract, left eye: Secondary | ICD-10-CM | POA: Diagnosis not present

## 2022-09-09 DIAGNOSIS — H25811 Combined forms of age-related cataract, right eye: Secondary | ICD-10-CM | POA: Diagnosis not present

## 2022-10-20 DIAGNOSIS — R411 Anterograde amnesia: Secondary | ICD-10-CM | POA: Diagnosis not present

## 2022-10-20 DIAGNOSIS — E039 Hypothyroidism, unspecified: Secondary | ICD-10-CM | POA: Diagnosis not present

## 2022-10-20 DIAGNOSIS — I10 Essential (primary) hypertension: Secondary | ICD-10-CM | POA: Diagnosis not present

## 2022-10-20 DIAGNOSIS — E1169 Type 2 diabetes mellitus with other specified complication: Secondary | ICD-10-CM | POA: Diagnosis not present

## 2022-10-20 DIAGNOSIS — G4733 Obstructive sleep apnea (adult) (pediatric): Secondary | ICD-10-CM | POA: Diagnosis not present

## 2022-10-20 DIAGNOSIS — G4700 Insomnia, unspecified: Secondary | ICD-10-CM | POA: Diagnosis not present

## 2022-10-20 DIAGNOSIS — E785 Hyperlipidemia, unspecified: Secondary | ICD-10-CM | POA: Diagnosis not present

## 2022-11-08 DIAGNOSIS — I639 Cerebral infarction, unspecified: Secondary | ICD-10-CM | POA: Diagnosis not present

## 2022-11-08 DIAGNOSIS — I10 Essential (primary) hypertension: Secondary | ICD-10-CM | POA: Diagnosis not present

## 2022-11-08 DIAGNOSIS — E1169 Type 2 diabetes mellitus with other specified complication: Secondary | ICD-10-CM | POA: Diagnosis not present

## 2022-12-20 DIAGNOSIS — E1169 Type 2 diabetes mellitus with other specified complication: Secondary | ICD-10-CM | POA: Diagnosis not present

## 2022-12-20 DIAGNOSIS — I10 Essential (primary) hypertension: Secondary | ICD-10-CM | POA: Diagnosis not present

## 2023-04-10 DIAGNOSIS — E119 Type 2 diabetes mellitus without complications: Secondary | ICD-10-CM | POA: Diagnosis not present

## 2023-04-10 DIAGNOSIS — Z961 Presence of intraocular lens: Secondary | ICD-10-CM | POA: Diagnosis not present

## 2023-06-13 DIAGNOSIS — I10 Essential (primary) hypertension: Secondary | ICD-10-CM | POA: Diagnosis not present

## 2023-06-13 DIAGNOSIS — E785 Hyperlipidemia, unspecified: Secondary | ICD-10-CM | POA: Diagnosis not present

## 2023-06-13 DIAGNOSIS — R413 Other amnesia: Secondary | ICD-10-CM | POA: Diagnosis not present

## 2023-06-13 DIAGNOSIS — E1169 Type 2 diabetes mellitus with other specified complication: Secondary | ICD-10-CM | POA: Diagnosis not present

## 2023-06-13 DIAGNOSIS — E039 Hypothyroidism, unspecified: Secondary | ICD-10-CM | POA: Diagnosis not present

## 2023-06-21 DIAGNOSIS — E1169 Type 2 diabetes mellitus with other specified complication: Secondary | ICD-10-CM | POA: Diagnosis not present

## 2023-06-21 DIAGNOSIS — I10 Essential (primary) hypertension: Secondary | ICD-10-CM | POA: Diagnosis not present

## 2023-06-21 DIAGNOSIS — E7800 Pure hypercholesterolemia, unspecified: Secondary | ICD-10-CM | POA: Diagnosis not present

## 2023-08-21 DIAGNOSIS — E0840 Diabetes mellitus due to underlying condition with diabetic neuropathy, unspecified: Secondary | ICD-10-CM | POA: Diagnosis not present

## 2023-08-21 DIAGNOSIS — E1169 Type 2 diabetes mellitus with other specified complication: Secondary | ICD-10-CM | POA: Diagnosis not present

## 2023-08-21 DIAGNOSIS — I10 Essential (primary) hypertension: Secondary | ICD-10-CM | POA: Diagnosis not present

## 2023-08-31 DIAGNOSIS — B86 Scabies: Secondary | ICD-10-CM | POA: Diagnosis not present

## 2023-08-31 DIAGNOSIS — L853 Xerosis cutis: Secondary | ICD-10-CM | POA: Diagnosis not present

## 2023-09-15 DIAGNOSIS — C301 Malignant neoplasm of middle ear: Secondary | ICD-10-CM | POA: Diagnosis not present

## 2023-09-27 DIAGNOSIS — E1169 Type 2 diabetes mellitus with other specified complication: Secondary | ICD-10-CM | POA: Diagnosis not present

## 2023-09-27 DIAGNOSIS — I10 Essential (primary) hypertension: Secondary | ICD-10-CM | POA: Diagnosis not present

## 2023-09-27 DIAGNOSIS — H8390 Unspecified disease of inner ear, unspecified ear: Secondary | ICD-10-CM | POA: Diagnosis not present

## 2023-09-27 DIAGNOSIS — I6330 Cerebral infarction due to thrombosis of unspecified cerebral artery: Secondary | ICD-10-CM | POA: Diagnosis not present

## 2023-09-27 DIAGNOSIS — E782 Mixed hyperlipidemia: Secondary | ICD-10-CM | POA: Diagnosis not present

## 2023-10-18 DIAGNOSIS — H93293 Other abnormal auditory perceptions, bilateral: Secondary | ICD-10-CM | POA: Diagnosis not present

## 2023-10-18 DIAGNOSIS — H6123 Impacted cerumen, bilateral: Secondary | ICD-10-CM | POA: Diagnosis not present

## 2023-10-18 DIAGNOSIS — R42 Dizziness and giddiness: Secondary | ICD-10-CM | POA: Diagnosis not present

## 2023-11-09 DIAGNOSIS — E1169 Type 2 diabetes mellitus with other specified complication: Secondary | ICD-10-CM | POA: Diagnosis not present

## 2023-11-09 DIAGNOSIS — E7800 Pure hypercholesterolemia, unspecified: Secondary | ICD-10-CM | POA: Diagnosis not present

## 2023-11-09 DIAGNOSIS — E119 Type 2 diabetes mellitus without complications: Secondary | ICD-10-CM | POA: Diagnosis not present

## 2023-11-09 DIAGNOSIS — I10 Essential (primary) hypertension: Secondary | ICD-10-CM | POA: Diagnosis not present

## 2023-12-22 DIAGNOSIS — E1169 Type 2 diabetes mellitus with other specified complication: Secondary | ICD-10-CM | POA: Diagnosis not present

## 2023-12-22 DIAGNOSIS — R413 Other amnesia: Secondary | ICD-10-CM | POA: Diagnosis not present

## 2023-12-22 DIAGNOSIS — I9589 Other hypotension: Secondary | ICD-10-CM | POA: Diagnosis not present

## 2024-01-08 DIAGNOSIS — E1169 Type 2 diabetes mellitus with other specified complication: Secondary | ICD-10-CM | POA: Diagnosis not present

## 2024-01-08 DIAGNOSIS — R413 Other amnesia: Secondary | ICD-10-CM | POA: Diagnosis not present

## 2024-01-08 DIAGNOSIS — I10 Essential (primary) hypertension: Secondary | ICD-10-CM | POA: Diagnosis not present

## 2024-02-22 DIAGNOSIS — E1169 Type 2 diabetes mellitus with other specified complication: Secondary | ICD-10-CM | POA: Diagnosis not present

## 2024-02-22 DIAGNOSIS — I10 Essential (primary) hypertension: Secondary | ICD-10-CM | POA: Diagnosis not present

## 2024-04-04 DIAGNOSIS — E1140 Type 2 diabetes mellitus with diabetic neuropathy, unspecified: Secondary | ICD-10-CM | POA: Diagnosis not present

## 2024-04-04 DIAGNOSIS — I10 Essential (primary) hypertension: Secondary | ICD-10-CM | POA: Diagnosis not present

## 2024-04-04 DIAGNOSIS — E0940 Drug or chemical induced diabetes mellitus with neurological complications with diabetic neuropathy, unspecified: Secondary | ICD-10-CM | POA: Diagnosis not present

## 2024-04-16 DIAGNOSIS — H16223 Keratoconjunctivitis sicca, not specified as Sjogren's, bilateral: Secondary | ICD-10-CM | POA: Diagnosis not present

## 2024-04-16 DIAGNOSIS — H04123 Dry eye syndrome of bilateral lacrimal glands: Secondary | ICD-10-CM | POA: Diagnosis not present

## 2024-04-16 DIAGNOSIS — E119 Type 2 diabetes mellitus without complications: Secondary | ICD-10-CM | POA: Diagnosis not present

## 2024-04-16 DIAGNOSIS — H10413 Chronic giant papillary conjunctivitis, bilateral: Secondary | ICD-10-CM | POA: Diagnosis not present

## 2024-04-16 DIAGNOSIS — Z794 Long term (current) use of insulin: Secondary | ICD-10-CM | POA: Diagnosis not present

## 2024-04-16 DIAGNOSIS — H0102B Squamous blepharitis left eye, upper and lower eyelids: Secondary | ICD-10-CM | POA: Diagnosis not present

## 2024-04-16 DIAGNOSIS — Z961 Presence of intraocular lens: Secondary | ICD-10-CM | POA: Diagnosis not present

## 2024-04-16 DIAGNOSIS — H0102A Squamous blepharitis right eye, upper and lower eyelids: Secondary | ICD-10-CM | POA: Diagnosis not present

## 2024-05-01 ENCOUNTER — Encounter: Payer: Self-pay | Admitting: *Deleted

## 2024-05-01 NOTE — Progress Notes (Signed)
 Sandy Davis                                          MRN: [id number]   05/01/2024   The VBCI Quality Team Specialist reviewed this patient medical record for the purposes of chart review for care gap closure. The following were reviewed: chart review for care gap closure-glycemic status assessment and kidney health evaluation for diabetes:eGFR  and uACR.    VBCI Quality Team

## 2024-06-19 NOTE — Progress Notes (Signed)
 Obera Stauch                                          MRN: [id number]   06/19/2024   The VBCI Quality Team Specialist reviewed this patient medical record for the purposes of chart review for care gap closure. The following were reviewed: chart review for care gap closure-colorectal cancer screening, glycemic status assessment, and kidney health evaluation for diabetes:eGFR  and uACR.    VBCI Quality Team

## 2024-07-19 ENCOUNTER — Emergency Department (HOSPITAL_COMMUNITY): Admission: EM | Admit: 2024-07-19 | Discharge: 2024-07-19 | Disposition: A | Discharge status: Home / Self Care

## 2024-07-19 ENCOUNTER — Emergency Department (HOSPITAL_COMMUNITY)

## 2024-07-19 ENCOUNTER — Encounter (HOSPITAL_COMMUNITY): Payer: Self-pay

## 2024-07-19 ENCOUNTER — Emergency Department (EMERGENCY_DEPARTMENT_HOSPITAL)

## 2024-07-19 DIAGNOSIS — M79661 Pain in right lower leg: Secondary | ICD-10-CM | POA: Diagnosis not present

## 2024-07-19 DIAGNOSIS — I129 Hypertensive chronic kidney disease with stage 1 through stage 4 chronic kidney disease, or unspecified chronic kidney disease: Secondary | ICD-10-CM | POA: Insufficient documentation

## 2024-07-19 DIAGNOSIS — Z79899 Other long term (current) drug therapy: Secondary | ICD-10-CM | POA: Diagnosis not present

## 2024-07-19 DIAGNOSIS — E1122 Type 2 diabetes mellitus with diabetic chronic kidney disease: Secondary | ICD-10-CM | POA: Diagnosis not present

## 2024-07-19 DIAGNOSIS — R0602 Shortness of breath: Secondary | ICD-10-CM | POA: Diagnosis present

## 2024-07-19 DIAGNOSIS — J180 Bronchopneumonia, unspecified organism: Secondary | ICD-10-CM | POA: Diagnosis not present

## 2024-07-19 DIAGNOSIS — Z7984 Long term (current) use of oral hypoglycemic drugs: Secondary | ICD-10-CM | POA: Diagnosis not present

## 2024-07-19 DIAGNOSIS — Z91040 Latex allergy status: Secondary | ICD-10-CM | POA: Diagnosis not present

## 2024-07-19 DIAGNOSIS — Z794 Long term (current) use of insulin: Secondary | ICD-10-CM | POA: Diagnosis not present

## 2024-07-19 DIAGNOSIS — Z7982 Long term (current) use of aspirin: Secondary | ICD-10-CM | POA: Insufficient documentation

## 2024-07-19 DIAGNOSIS — N189 Chronic kidney disease, unspecified: Secondary | ICD-10-CM | POA: Insufficient documentation

## 2024-07-19 DIAGNOSIS — R911 Solitary pulmonary nodule: Secondary | ICD-10-CM | POA: Insufficient documentation

## 2024-07-19 DIAGNOSIS — R609 Edema, unspecified: Secondary | ICD-10-CM

## 2024-07-19 LAB — CBC
HCT: 36.5 % (ref 36.0–46.0)
Hemoglobin: 11.7 g/dL — ABNORMAL LOW (ref 12.0–15.0)
MCH: 31.2 pg (ref 26.0–34.0)
MCHC: 32.1 g/dL (ref 30.0–36.0)
MCV: 97.3 fL (ref 80.0–100.0)
Platelets: 248 K/uL (ref 150–400)
RBC: 3.75 MIL/uL — ABNORMAL LOW (ref 3.87–5.11)
RDW: 12.4 % (ref 11.5–15.5)
WBC: 7.5 K/uL (ref 4.0–10.5)
nRBC: 0 % (ref 0.0–0.2)

## 2024-07-19 LAB — BASIC METABOLIC PANEL WITH GFR
Anion gap: 7 (ref 5–15)
BUN: 15 mg/dL (ref 8–23)
CO2: 28 mmol/L (ref 22–32)
Calcium: 10.1 mg/dL (ref 8.9–10.3)
Chloride: 107 mmol/L (ref 98–111)
Creatinine, Ser: 0.81 mg/dL (ref 0.44–1.00)
GFR, Estimated: >60 mL/min
Glucose, Bld: 43 mg/dL — CL (ref 70–99)
Potassium: 3.8 mmol/L (ref 3.5–5.1)
Sodium: 142 mmol/L (ref 135–145)

## 2024-07-19 LAB — RESP PANEL BY RT-PCR (RSV, FLU A&B, COVID)  RVPGX2
Influenza A by PCR: NEGATIVE
Influenza B by PCR: NEGATIVE
Resp Syncytial Virus by PCR: NEGATIVE
SARS Coronavirus 2 by RT PCR: NEGATIVE

## 2024-07-19 LAB — PRO BRAIN NATRIURETIC PEPTIDE: Pro Brain Natriuretic Peptide: <50 pg/mL

## 2024-07-19 LAB — CBG MONITORING, ED
Glucose-Capillary: 41 mg/dL — CL (ref 70–99)
Glucose-Capillary: 86 mg/dL (ref 70–99)

## 2024-07-19 LAB — TROPONIN T, HIGH SENSITIVITY: Troponin T High Sensitivity: <15 ng/L (ref 0–19)

## 2024-07-19 MED ORDER — AZITHROMYCIN 250 MG PO TABS: 250.0000 mg | ORAL_TABLET | Freq: Every day | ORAL | 0 refills | Status: AC

## 2024-07-19 MED ORDER — SODIUM CHLORIDE 0.9 % IV SOLN: 500.0000 mg | Freq: Once | INTRAVENOUS | Status: DC

## 2024-07-19 MED ORDER — IOHEXOL 350 MG/ML SOLN
75.0000 mL | Freq: Once | INTRAVENOUS | Status: AC | PRN
  Administered 2024-07-19: 75 mL via INTRAVENOUS

## 2024-07-19 MED ORDER — AMOXICILLIN-POT CLAVULANATE 875-125 MG PO TABS: 1.0000 | ORAL_TABLET | Freq: Two times a day (BID) | ORAL | 0 refills | Status: AC

## 2024-07-19 MED ORDER — AZITHROMYCIN 250 MG PO TABS
500.0000 mg | ORAL_TABLET | Freq: Once | ORAL | Status: AC
  Administered 2024-07-19: 500 mg via ORAL
  Filled 2024-07-19: qty 2

## 2024-07-19 MED ORDER — SODIUM CHLORIDE 0.9 % IV SOLN
2.0000 g | Freq: Once | INTRAVENOUS | Status: AC
  Administered 2024-07-19: 2 g via INTRAVENOUS
  Filled 2024-07-19: qty 20

## 2024-07-19 MED ORDER — ALBUTEROL SULFATE HFA 108 (90 BASE) MCG/ACT IN AERS: 2.0000 | INHALATION_SPRAY | RESPIRATORY_TRACT | 0 refills | Status: AC | PRN

## 2024-07-19 NOTE — Discharge Instructions (Addendum)
 Please go back to 30 units of your Lantus until you are able to follow-up with your doctor and have your blood sugars rechecked.  Please take the antibiotics for the pneumonia finding seen on the CT scan.  They were not able to say for sure whether this was pneumonia or a lung cancer.  I have referred you to our oncology team and you should receive a call regarding follow-up soon regarding this.  Please return to the ER for worsening symptoms.

## 2024-07-19 NOTE — Progress Notes (Signed)
 Right lower extremity venous duplex has been completed.  Results can be found in chart review under CV Proc.  07/19/2024 10:22 AM  Elijahjames Fuelling Elden Appl, RVT.

## 2024-07-19 NOTE — ED Triage Notes (Signed)
 Pt here with c/o SHOB that started this morning.

## 2024-07-19 NOTE — ED Notes (Signed)
IV team consulted- 

## 2024-07-19 NOTE — ED Notes (Signed)
 Pt ambulated in hallway without assistance. Spo2 at 100% RA and pulse between 88-91 while ambulating. Ula, MD made aware. Pt requesting to speak with MD regarding plan of care and antibiotics. EDP notified.

## 2024-07-19 NOTE — ED Notes (Signed)
 EDP aware of CBG 41 patient is alert oriented skin w/d, orange juice and graham crackers with peanut butter given

## 2024-07-19 NOTE — ED Provider Notes (Signed)
 " [place] [facility] AT [facility] Provider Note   CSN: [id number] Arrival date & time: 07/19/24  9263     Patient presents with: Shortness of Breath   Sandy Davis is a 69 y.o. female.   69 year old female past medical history of diabetes, hypertension, and dementia presenting to the emergency department today with shortness of breath.  The patient states that she was not able to sleep last night due to shortness of breath.  Reports this was a lot worse this morning.  She states that she was short of breath when she was laying flat last night.  She has been having some unilateral leg swelling now over the past few days.  Reports she has had some swelling in the right leg.  She denies any chest pain with this.  She came to the ER today for further evaluation regarding this due to ongoing symptoms.  Her family who did bring her in did state that she did appear to be very dyspneic this morning and has improved some as the day has gone on.  She denies any productive cough.  Denies any history of asthma or COPD.   Shortness of Breath      Prior to Admission medications  Medication Sig Start Date End Date Taking? Authorizing Provider  albuterol  (VENTOLIN  HFA) 108 (90 Base) MCG/ACT inhaler Inhale 2 puffs into the lungs every 4 (four) hours as needed for wheezing or shortness of breath. 07/19/24  Yes Ula Prentice SAUNDERS, MD  amLODipine  (NORVASC ) 10 MG tablet Take 10 mg by mouth daily.   Yes [provider]  amoxicillin -clavulanate (AUGMENTIN ) 875-125 MG tablet Take 1 tablet by mouth every 12 (twelve) hours. 07/19/24  Yes Ula Prentice SAUNDERS, MD  aspirin  EC 81 MG tablet Take 81 mg by mouth daily.   Yes [provider]  azithromycin  (ZITHROMAX ) 250 MG tablet Take 1 tablet (250 mg total) by mouth daily. Take 1 tablet by mouth daily (first dose given in emergency department) 07/19/24  Yes Ula Prentice SAUNDERS, MD  busPIRone (BUSPAR) 10 MG tablet Take 10 mg by mouth daily.  04/23/24  Yes [provider]  citalopram (CELEXA) 20 MG tablet Take 20 mg by mouth daily. 06/25/24  Yes [provider]  empagliflozin (JARDIANCE) 25 MG TABS tablet Take 25 mg by mouth daily.   Yes [provider]  LANTUS SOLOSTAR 100 UNIT/ML Solostar Pen Inject 40 Units into the skin daily. 07/08/24  Yes [provider]  lisinopril-hydrochlorothiazide (ZESTORETIC) 20-12.5 MG tablet Take 1 tablet by mouth every morning.   Yes [provider]  OZEMPIC, 2 MG/DOSE, 8 MG/3ML SOPN Inject 2 mg into the skin once a week. 01/29/24  Yes [provider]  simvastatin  (ZOCOR ) 40 MG tablet Take 40 mg by mouth daily.   Yes [provider]    Allergies: Shellfish allergy and Latex    Review of Systems  Respiratory:  Positive for shortness of breath.   All other systems reviewed and are negative.   Updated Vital Signs BP (!) 160/80   Pulse 69   Temp 97.9 F (36.6 C) (Oral)   Resp 18   Ht 5' 7 (1.702 m)   Wt 77.1 kg   SpO2 100%   BMI 26.63 kg/m   Physical Exam Vitals and nursing note reviewed.   Gen: NAD Eyes: PERRL, EOMI HEENT: no oropharyngeal swelling Neck: trachea midline Resp: clear to auscultation bilaterally Card: RRR, no murmurs, rubs, or gallops Abd: nontender, nondistended Extremities:  The patient does have some calf tenderness on the right as well as some trace edema noted to the right Vascular: 2+ radial pulses bilaterally, 2+ DP pulses bilaterally Neuro: no focal deficits Skin: no rashes Psyc: acting appropriately   (all labs ordered are listed, but only abnormal results are displayed) Labs Reviewed  BASIC METABOLIC PANEL WITH GFR - Abnormal; Notable for the following components:      Result Value   Glucose, Bld 43 (*)    All other components within normal limits  CBC - Abnormal; Notable for the following components:   RBC 3.75 (*)    Hemoglobin 11.7 (*)    All other components within normal limits  CBG  MONITORING, ED - Abnormal; Notable for the following components:   Glucose-Capillary 41 (*)    All other components within normal limits  RESP PANEL BY RT-PCR (RSV, FLU A&B, COVID)  RVPGX2  PRO BRAIN NATRIURETIC PEPTIDE  CBG MONITORING, ED  TROPONIN T, HIGH SENSITIVITY    EKG: None  Radiology: CT Angio Chest PE W and/or Wo Contrast Result Date: 07/19/2024 EXAM: CTA CHEST 07/19/2024 10:58:41 AM TECHNIQUE: CTA of the chest was performed after the administration of 75 mL of iohexol  (OMNIPAQUE ) 350 MG/ML injection. Multiplanar reformatted images are provided for review. MIP images are provided for review. Automated exposure control, iterative reconstruction, and/or weight based adjustment of the mA/kV was utilized to reduce the radiation dose to as low as reasonably achievable. COMPARISON: 07/29/2012 CLINICAL HISTORY: Pulmonary embolism (PE) suspected, high probability. FINDINGS: PULMONARY ARTERIES: Pulmonary arteries are adequately opacified for evaluation. No acute pulmonary embolus. Main pulmonary artery is normal in caliber. MEDIASTINUM: Small to moderate volume pericardial effusion. Fairly diffuse aortic atherosclerosis. LYMPH NODES: No mediastinal, hilar or axillary lymphadenopathy. LUNGS AND PLEURA: In the right upper lobe, there is a region of peribronchial masslike consolidation, measuring 1.3 x 3.7 x 1.9 cm. Multifocal subsegmental atelectasis in the lung bases. No pulmonary edema. No evidence of pleural effusion or pneumothorax. UPPER ABDOMEN: Small nonobstructive left upper pole calculus. SOFT TISSUES AND BONES: A few small remote, healed right sided rib fractures. Multilevel degenerative disc disease of the spine. Osteopenia. No acute bone or soft tissue abnormality. IMPRESSION: 1. No evidence of pulmonary embolism. 2. Right upper lobe peribronchial masslike consolidation measuring 1.3 x 3.7 x 1.9 cm. This could represent bronchopneumonia, in the correct clinical context, or a primary lung  neoplasm. Electronically signed by: Rogelia Myers MD 07/19/2024 11:36 AM EST RP Workstation: [id number]   VAS US  LOWER EXTREMITY VENOUS (DVT) (ONLY [facility] & [facility]) Result Date: 07/19/2024  Lower Venous DVT Study Patient Name:  CLORIA CIRESI  Date of Exam:   07/19/2024 Medical Rec #: [id number]       Accession #:    [id number] Date of Birth: October 06, 1955        Patient Gender: F Patient Age:   26 years Exam Location:  [facility] Procedure:      VAS US  LOWER EXTREMITY VENOUS (DVT) Referring Phys: PRENTICE Conard Alvira --------------------------------------------------------------------------------  Indications: Edema, and SOB.  Comparison Study: Previous study on 9.21.2022. Performing Technologist: Edilia Elden Appl  Examination Guidelines: A complete evaluation includes B-mode imaging, spectral Doppler, color Doppler, and power Doppler as needed of all accessible portions of each vessel. Bilateral testing is considered an integral part of a complete examination. Limited examinations for reoccurring indications may be performed as noted. The reflux portion of the exam is performed with the patient in reverse Trendelenburg.  +---------+---------------+---------+-----------+----------+--------------+ RIGHT    CompressibilityPhasicitySpontaneityPropertiesThrombus  Aging +---------+---------------+---------+-----------+----------+--------------+ CFV      Full           Yes      Yes                                 +---------+---------------+---------+-----------+----------+--------------+ SFJ      Full           Yes      Yes                                 +---------+---------------+---------+-----------+----------+--------------+ FV Prox  Full                                                        +---------+---------------+---------+-----------+----------+--------------+ FV Mid   Full                                                         +---------+---------------+---------+-----------+----------+--------------+ FV DistalFull                                                        +---------+---------------+---------+-----------+----------+--------------+ PFV      Full                                                        +---------+---------------+---------+-----------+----------+--------------+ POP      Full           Yes      Yes                                 +---------+---------------+---------+-----------+----------+--------------+ PTV      Full                                                        +---------+---------------+---------+-----------+----------+--------------+ PERO     Full                                                        +---------+---------------+---------+-----------+----------+--------------+   +----+---------------+---------+-----------+----------+--------------+ LEFTCompressibilityPhasicitySpontaneityPropertiesThrombus Aging +----+---------------+---------+-----------+----------+--------------+ CFV Full           Yes      Yes                                 +----+---------------+---------+-----------+----------+--------------+ SFJ  Full           Yes      Yes                                 +----+---------------+---------+-----------+----------+--------------+     Summary: RIGHT: - There is no evidence of deep vein thrombosis in the lower extremity.  - No cystic structure found in the popliteal fossa.  LEFT: - No evidence of common femoral vein obstruction.   *See table(s) above for measurements and observations.    Preliminary    DG Chest 2 View Result Date: 07/19/2024 CLINICAL DATA:  Shortness of breath EXAM: CHEST - 2 VIEW COMPARISON:  July 28, 2012 FINDINGS: The heart size and mediastinal contours are within normal limits. Ill-defined nodular density is seen in right upper lobe with another but smaller abnormality seen toward right lung apex. These are  concerning for possible nodules or masses and CT scan is recommended for further evaluation. Left lung is clear. The visualized skeletal structures are unremarkable. IMPRESSION: Possible right upper lobe nodules or masses are noted. CT scan of the chest is recommended for further evaluation. Electronically Signed   By: Lynwood Landy Raddle M.D.   On: 07/19/2024 08:27     Procedures   Medications Ordered in the ED  cefTRIAXone  (ROCEPHIN ) 2 g in sodium chloride  0.9 % 100 mL IVPB (has no administration in time range)  azithromycin  (ZITHROMAX ) tablet 500 mg (has no administration in time range)  iohexol  (OMNIPAQUE ) 350 MG/ML injection 75 mL (75 mLs Intravenous Contrast Given 07/19/24 1101)                                    Medical Decision Making 69 year old female with past medical history of diabetes, hypertension, hyperlipidemia, and CKD presenting to the emergency department today with dyspnea.  The patient is well-appearing here on exam with stable vital signs.  Her pulse ox is a little low at 94%.  I do not appreciate anything on her lung exam here.  She denies any infectious symptoms.  Given the unilateral leg swelling and shortness of breath we will further evaluate her here with basic labs to evaluate for anemia or electrolyte abnormalities.  Will obtain EKG and troponin to eval for atypical ACS as well as a BNP to evaluate for underlying CHF.  Will obtain a CT angiogram to evaluate for pulmonary embolism in addition to ultrasound of the right lower extremity for further evaluation for DVT given the unilateral leg swelling.  I will give the patient a DuoNeb here to see if this helps with her symptoms but she does not really have much as far as wheezing here on exam.  I will reevaluate for ultimate disposition.  The patient's labs were largely unremarkable.  Ultrasound does not show any findings consistent with DVT.  The patient's CT scan does show findings concerning bronchopneumonia versus  malignancy.  Given the acute onset of her symptoms we will treat her for pneumonia.  She is given IV Rocephin  and azithromycin  here.  Ambulatory pulse ox was 100%.  The patient was also found to be hypoglycemic.  This improved with oral intake.  The patient did not eat this morning.  I have encouraged her to back off on her Lantus until she is able to follow-up with her primary care doctor.  Have referred her to  oncology as an outpatient to follow-up regarding the CT imaging findings.  She is at her baseline and satting 100% on room air.  She is discharged with return precautions.  Amount and/or Complexity of Data Reviewed Labs: ordered. Radiology: ordered.  Risk Prescription drug management.        Final diagnoses:  Bronchopneumonia  Lesion of lung    ED Discharge Orders          Ordered    Ambulatory referral to Hematology / Oncology       Comments: Your emergency department provider has referred you to see a hematology/oncology specialist. These are physicians who specialize in blood disorders and cancers, or findings concerning for cancer. You will receive a phone call from the [facility] to set up your appointment within 2 business days: [employer] operate Mon - Fri, 8:00 a.m. to 5:00 p.m.; closed for federally recognized holidays. Please be sure your phone is not set to block numbers during this time.   07/19/24 1225    amoxicillin -clavulanate (AUGMENTIN ) 875-125 MG tablet  Every 12 hours        07/19/24 1237    azithromycin  (ZITHROMAX ) 250 MG tablet  Daily        07/19/24 1237    albuterol  (VENTOLIN  HFA) 108 (90 Base) MCG/ACT inhaler  Every 4 hours PRN        07/19/24 1237               Ula Prentice SAUNDERS, MD 07/19/24 1240  "

## 2024-07-22 ENCOUNTER — Encounter: Payer: Self-pay | Admitting: Medical Oncology

## 2024-07-24 ENCOUNTER — Inpatient Hospital Stay: Attending: Physician Assistant | Admitting: Physician Assistant

## 2024-07-24 ENCOUNTER — Encounter: Payer: Self-pay | Admitting: Medical Oncology

## 2024-07-24 ENCOUNTER — Encounter: Payer: Self-pay | Admitting: Physician Assistant

## 2024-07-24 ENCOUNTER — Inpatient Hospital Stay

## 2024-07-24 VITALS — BP 156/78 | HR 59 | Temp 97.2°F | Resp 18 | Wt 176.0 lb

## 2024-07-24 DIAGNOSIS — Z794 Long term (current) use of insulin: Secondary | ICD-10-CM | POA: Diagnosis not present

## 2024-07-24 DIAGNOSIS — E1122 Type 2 diabetes mellitus with diabetic chronic kidney disease: Secondary | ICD-10-CM | POA: Diagnosis not present

## 2024-07-24 DIAGNOSIS — Z7982 Long term (current) use of aspirin: Secondary | ICD-10-CM | POA: Diagnosis not present

## 2024-07-24 DIAGNOSIS — Z79899 Other long term (current) drug therapy: Secondary | ICD-10-CM | POA: Insufficient documentation

## 2024-07-24 DIAGNOSIS — E1140 Type 2 diabetes mellitus with diabetic neuropathy, unspecified: Secondary | ICD-10-CM | POA: Insufficient documentation

## 2024-07-24 DIAGNOSIS — N189 Chronic kidney disease, unspecified: Secondary | ICD-10-CM | POA: Diagnosis not present

## 2024-07-24 DIAGNOSIS — Z87891 Personal history of nicotine dependence: Secondary | ICD-10-CM | POA: Insufficient documentation

## 2024-07-24 DIAGNOSIS — G4733 Obstructive sleep apnea (adult) (pediatric): Secondary | ICD-10-CM | POA: Insufficient documentation

## 2024-07-24 DIAGNOSIS — Z7985 Long-term (current) use of injectable non-insulin antidiabetic drugs: Secondary | ICD-10-CM | POA: Diagnosis not present

## 2024-07-24 DIAGNOSIS — R918 Other nonspecific abnormal finding of lung field: Secondary | ICD-10-CM

## 2024-07-24 DIAGNOSIS — I129 Hypertensive chronic kidney disease with stage 1 through stage 4 chronic kidney disease, or unspecified chronic kidney disease: Secondary | ICD-10-CM | POA: Diagnosis not present

## 2024-07-24 DIAGNOSIS — Z7984 Long term (current) use of oral hypoglycemic drugs: Secondary | ICD-10-CM | POA: Diagnosis not present

## 2024-07-24 LAB — CBC WITH DIFFERENTIAL (CANCER CENTER ONLY)
Abs Immature Granulocytes: 0.01 K/uL (ref 0.00–0.07)
Basophils Absolute: 0 K/uL (ref 0.0–0.1)
Basophils Relative: 0 %
Eosinophils Absolute: 0.6 K/uL — ABNORMAL HIGH (ref 0.0–0.5)
Eosinophils Relative: 8 %
HCT: 36.3 % (ref 36.0–46.0)
Hemoglobin: 11.8 g/dL — ABNORMAL LOW (ref 12.0–15.0)
Immature Granulocytes: 0 %
Lymphocytes Relative: 28 %
Lymphs Abs: 2.1 K/uL (ref 0.7–4.0)
MCH: 30.8 pg (ref 26.0–34.0)
MCHC: 32.5 g/dL (ref 30.0–36.0)
MCV: 94.8 fL (ref 80.0–100.0)
Monocytes Absolute: 0.4 K/uL (ref 0.1–1.0)
Monocytes Relative: 6 %
Neutro Abs: 4.2 K/uL (ref 1.7–7.7)
Neutrophils Relative %: 58 %
Platelet Count: 250 K/uL (ref 150–400)
RBC: 3.83 MIL/uL — ABNORMAL LOW (ref 3.87–5.11)
RDW: 12 % (ref 11.5–15.5)
WBC Count: 7.4 K/uL (ref 4.0–10.5)
nRBC: 0 % (ref 0.0–0.2)

## 2024-07-24 LAB — CMP (CANCER CENTER ONLY)
ALT: 24 U/L (ref 0–44)
AST: 22 U/L (ref 15–41)
Albumin: 4.2 g/dL (ref 3.5–5.0)
Alkaline Phosphatase: 87 U/L (ref 38–126)
Anion gap: 8 (ref 5–15)
BUN: 15 mg/dL (ref 8–23)
CO2: 28 mmol/L (ref 22–32)
Calcium: 10.6 mg/dL — ABNORMAL HIGH (ref 8.9–10.3)
Chloride: 105 mmol/L (ref 98–111)
Creatinine: 0.91 mg/dL (ref 0.44–1.00)
GFR, Estimated: >60 mL/min
Glucose, Bld: 93 mg/dL (ref 70–99)
Potassium: 4.2 mmol/L (ref 3.5–5.1)
Sodium: 141 mmol/L (ref 135–145)
Total Bilirubin: 0.3 mg/dL (ref 0.0–1.2)
Total Protein: 7.4 g/dL (ref 6.5–8.1)

## 2024-07-24 LAB — LACTATE DEHYDROGENASE: LDH: 139 U/L (ref 105–235)

## 2024-07-24 NOTE — Progress Notes (Signed)
 [facility]  Patient presented to clinic, alone, for her scheduled appointment with PA-C Johnston. I introduced myself and provided her with my direct contact information. Patient was encouraged to call me with any questions/concerns she may have.  [name], RN, BSN Oncology Nurse Navigator, [facility] 07/24/2024 3:29 PM
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
## 2024-07-24 NOTE — Patient Instructions (Signed)
 Rapid Diagnostic Service Visit Discharge Information and Instructions  Thank you for choosing [facility] for your healthcare needs.  Below is a summary of today's discussion, along with our contact information and an outline of what to expect next.  Reason for Visit:  Right lung mass like consolidation   Proposed Diagnostic Care Plan: Labs PET/CT scan to evaluate if biopsy is needed.   What to Expect: - Generally, when lab tests are ordered the results can take up to 1 week for results to be available.  At that point, we will contact you to discuss your results with you.  Unless there is a critical result, we will typically wait for all of your lab results to be available before contacting you. - If a biopsy is part of your Care Plan, those results can take on average 7-10 days to result.  Once results are available, we will contact you to discuss your pathology results and any next steps. - If you have additional imaging ordered, such as a CT Scan, MRI, Ultrasound, Bone Scan, or PET scan, your imaging will need to be authorized then scheduled with the earliest available appointment.  You may be asked to travel to another hospital within [facility] who has a sooner availability, please consider doing so if asked. - If you use MyChart, your results will be available to you in the MyChart portal.  Your provider will be in touch with you as soon as all of your results are available to be discussed.  Your Diagnostic Clinic Provider:  Johnston Police PA-C and Dr. Norleen Kidney, contact number [phone] Your Diagnostic Navigator:  Colene Raider RN, contact number [phone]  If you or your caregiver have number blocking on your cell phones, please ensure the cancer center's numbers are not blocked.  If you are not a registered MyChart user, please consider enrolling in MyChart to receive your test results and visit notes.  You can also access your discharge instructions electronically.  MyChart  also gives you an electronic means to communicate with your Care Team instead of needing to call in to the cancer center.  We appreciate you trusting us  with your healthcare and look forward to partnering with you as we work to uncover what your potential diagnosis may be.  Please do not hesitate to reach out at any point with questions or concerns.

## 2024-07-26 ENCOUNTER — Encounter: Payer: Self-pay | Admitting: Medical Oncology

## 2024-07-26 NOTE — Progress Notes (Signed)
 Rapid Diagnostic Services  LVM informing patient of her lab results from 07/24/2024. Patient encouraged to call me with questions/concerns.  [name], RN, BSN Oncology Nurse Navigator, [facility] 07/26/2024 2:08 PM

## 2024-08-02 ENCOUNTER — Encounter (HOSPITAL_COMMUNITY)
Admission: RE | Admit: 2024-08-02 | Discharge: 2024-08-02 | Disposition: A | Source: Ambulatory Visit | Attending: Physician Assistant

## 2024-08-02 ENCOUNTER — Telehealth: Payer: Self-pay | Admitting: Acute Care

## 2024-08-02 ENCOUNTER — Telehealth: Payer: Self-pay | Admitting: Physician Assistant

## 2024-08-02 DIAGNOSIS — E041 Nontoxic single thyroid nodule: Secondary | ICD-10-CM

## 2024-08-02 DIAGNOSIS — R918 Other nonspecific abnormal finding of lung field: Secondary | ICD-10-CM | POA: Insufficient documentation

## 2024-08-02 LAB — GLUCOSE, CAPILLARY: Glucose-Capillary: 124 mg/dL — ABNORMAL HIGH (ref 70–99)

## 2024-08-02 MED ORDER — FLUDEOXYGLUCOSE F - 18 (FDG) INJECTION
7.0000 | Freq: Once | INTRAVENOUS | Status: AC
  Administered 2024-08-02: 8.7 via INTRAVENOUS

## 2024-08-02 NOTE — Telephone Encounter (Signed)
 Ladies, we need to get this patient scheduled with Dr. Shelah either 08/08/2024 at 10:30 , or 08/09/2024 at 1-:30. New nodule consult. Needs a 30 minute visit. Thanks so much. She has a large pulmonary nodule that is PET avid, and needs a bronch with biopsy. thanks

## 2024-08-02 NOTE — Telephone Encounter (Signed)
 After several attempts to reach patient, I spoke to patient's daughter, Sandy Davis, to review PET scan results and recommendations.   Findings revealed RUL pulmonary lesion that is markedly hypermetabolic. Dr. Federico recommends referral to pulmonology to assess for EBUS/biopsy. There was an incidental finding of 2.6 cm right thyroid  nodule with hypermetabolism. Will obtain thyroid  US  to further evaluate.   Sandy Davis expressed understanding of the plan provided.

## 2024-08-05 NOTE — Telephone Encounter (Signed)
 Spoke with patient and scheduled to see Dr Shelah 08/09/24 at 1:00. PT verbalized understanding.

## 2024-08-07 ENCOUNTER — Ambulatory Visit (HOSPITAL_COMMUNITY): Admission: RE | Admit: 2024-08-07 | Discharge: 2024-08-07 | Attending: Physician Assistant

## 2024-08-07 DIAGNOSIS — E041 Nontoxic single thyroid nodule: Secondary | ICD-10-CM

## 2024-08-09 ENCOUNTER — Encounter: Payer: Self-pay | Admitting: Emergency Medicine

## 2024-08-09 ENCOUNTER — Ambulatory Visit: Admitting: Emergency Medicine

## 2024-08-09 NOTE — Telephone Encounter (Signed)
 Pt no show for ov 2/6 with Dr. Shelah Fortis the pt and couldn't reach her so I called the pts daughter and rescheduled appt for 2/9 at 11:30 with Lauraine, NP.

## 2024-08-09 NOTE — Telephone Encounter (Signed)
 Spoke with the pts daughter and they will come in at 50 today for ov with Dr. Byrum
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
## 2024-08-12 ENCOUNTER — Ambulatory Visit: Admitting: Acute Care
